# Patient Record
Sex: Female | Born: 1960 | Race: Black or African American | Hispanic: No | Marital: Married | Smoking: Current every day smoker
Health system: Southern US, Community
[De-identification: ages and names within clinical notes are randomized; demographics above are authoritative.]

## PROBLEM LIST (undated history)

## (undated) DIAGNOSIS — R7303 Prediabetes: Secondary | ICD-10-CM

## (undated) DIAGNOSIS — E7800 Pure hypercholesterolemia, unspecified: Secondary | ICD-10-CM

## (undated) HISTORY — DX: Prediabetes: R73.03

## (undated) HISTORY — PX: OTHER SURGICAL HISTORY: SHX169

## (undated) HISTORY — PX: BREAST EXCISIONAL BIOPSY: SUR124

## (undated) HISTORY — PX: PLANTAR FASCIA SURGERY: SHX746

## (undated) HISTORY — DX: Pure hypercholesterolemia, unspecified: E78.00

## (undated) HISTORY — PX: WRIST SURGERY: SHX841

---

## 1988-07-21 HISTORY — PX: TUBAL LIGATION: SHX77

## 2004-08-22 ENCOUNTER — Other Ambulatory Visit: Admission: RE | Admit: 2004-08-22 | Discharge: 2004-08-22 | Payer: Self-pay | Admitting: Gynecology

## 2004-09-10 ENCOUNTER — Encounter: Admission: RE | Admit: 2004-09-10 | Discharge: 2004-09-10 | Payer: Self-pay | Admitting: Gynecology

## 2004-09-14 ENCOUNTER — Encounter: Admission: RE | Admit: 2004-09-14 | Discharge: 2004-09-14 | Payer: Self-pay | Admitting: Interventional Radiology

## 2004-10-08 ENCOUNTER — Encounter: Admission: RE | Admit: 2004-10-08 | Discharge: 2004-10-08 | Payer: Self-pay | Admitting: Family Medicine

## 2004-10-17 ENCOUNTER — Observation Stay (HOSPITAL_COMMUNITY): Admission: RE | Admit: 2004-10-17 | Discharge: 2004-10-18 | Payer: Self-pay | Admitting: Interventional Radiology

## 2005-04-22 ENCOUNTER — Encounter: Admission: RE | Admit: 2005-04-22 | Discharge: 2005-04-22 | Payer: Self-pay | Admitting: Gynecology

## 2005-06-18 ENCOUNTER — Encounter: Admission: RE | Admit: 2005-06-18 | Discharge: 2005-06-18 | Payer: Self-pay | Admitting: Family Medicine

## 2005-09-30 ENCOUNTER — Encounter: Admission: RE | Admit: 2005-09-30 | Discharge: 2005-09-30 | Payer: Self-pay | Admitting: Family Medicine

## 2005-10-23 ENCOUNTER — Other Ambulatory Visit: Admission: RE | Admit: 2005-10-23 | Discharge: 2005-10-23 | Payer: Self-pay | Admitting: Gynecology

## 2006-02-10 IMAGING — US IR US GUIDE VASC ACCESS RIGHT
1 series · 1 of 1 positions shown · IV contrast (omnipaque)
Comparison: none

CLINICAL DATA: 43 year old female with a dominant submucosal 4.5 cm uterine fibroid with menorrhagia and pelvic pain.
ULTRASOUND AND FLUOROSCOPICALLY GUIDED SELECTIVE BILATERAL UTERINE ANGIOGRAMS WITH TRANSCATHETER EMBOLIZATION:
Radiologist:  Juank Dunlap, M.D. 
Guidance:  Ultrasound and fluoroscopic.
Complications:  No immediate complications.
Medications:  10 mg Versed, 350 mcg fentanyl, 2 mg of Dilaudid, 30 mg of Toradol, 1 g of Ancef and 200 mcg of nitroglycerin.
Contrast:  160 cc of Omnipaque 300.
Conscious Sedation Time:  145 minutes.
PROCEDURE/FINDINGS:
Informed consent was obtained from the patient.   
With ultrasound guidance, the right common femoral artery was identified.  Under sterile conditions and local anesthesia, micropuncture needle access was performed.  Images were obtained for documentation.  A 6 French sheath was inserted over a Bentson guidewire.  Initially, the left internal iliac artery was selected with a C2 catheter over a glidewire.  Selective left internal iliac angiogram was performed.  Through this access, a Terumo microcatheter and micro-glidewire were utilized to select the left uterine artery along the horizontal portion.  Contrast was injected for a selective left uterine angiogram.  This demonstrates a tortuous and large left uterine artery supplying the fibroid uterus.  From this location, complete embolization was performed of the left uterine artery with 1 vial of 500-700 micron Contour-SE particles, 2 vials of 500-700 micron Embospheres, and [DATE] vial of 700-900 micron Embospheres.
Following embolization, a follow-up angiogram was performed through the catheter demonstrating complete stasis of the left uterine artery with no longer opacification of the parenchymal branches.  
At this point, the catheter was removed.  The C2 catheter was retracted to the ipsilateral right internal iliac origin.  This catheter was unsuccessful in accessing the right internal iliac artery.  The catheter was exchanged for a Chung 2 catheter which was utilized to select the right internal iliac arterial origin.  Selective right internal iliac angiogram was performed identifying the right uterine artery.  Through the access, the Terumo microcatheter and glidewire were utilized to select the right uterine artery.  Selective right uterine angiogram was performed demonstrating an enlarged, tortuous, right uterine artery with evidence of sluggish flow upon injection consistent with cross-embolization from the left-sided injection.  
The catheter was placed in a horizontal portion of the right uterine artery lower in the pelvis.  From this location, complete embolization was performed with 2 vials of 500-700 micron embosphere particles.
A follow-up angiogram demonstrated complete stasis of the right uterine artery with no longer opacification of the parenchymal branches.
At this point, the microcatheter was removed.  The Chung 2 catheter was deformed over the bifurcation and removed.  Contrast was injected to confirm that the access site was appropriate for a percutaneous closure device.  The puncture site is within the common femoral artery and there is no evidence of vasospasm or adjacent major branches.  Therefore, hemostasis was obtained with a 6 French Starclose device .  The patient tolerated the procedure well and there were no immediate complications.

[Series 1: sp us guide vasc access*right* · 1 of 1 slices shown]
[im 1/1]
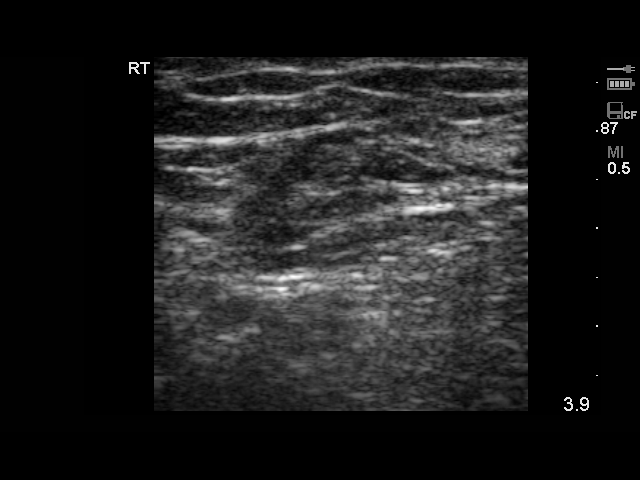

[1 of 1 positions shown; findings below may reference images not displayed]

IMPRESSION: Bilateral selective uterine angiograms with transcatheter embolization to complete stasis as described for treatment of symptomatic uterine fibroids.
Plan:  The patient will be admitted for overnight observation admission with pain management and hydration.

## 2006-02-10 IMAGING — XA IR TRANSCATH EMBOLIZATION
1 series · 13 of 24 positions shown · IV contrast (omnipaque)
Comparison: none

CLINICAL DATA: 43 year old female with a dominant submucosal 4.5 cm uterine fibroid with menorrhagia and pelvic pain.
ULTRASOUND AND FLUOROSCOPICALLY GUIDED SELECTIVE BILATERAL UTERINE ANGIOGRAMS WITH TRANSCATHETER EMBOLIZATION:
Radiologist:  Juank Dunlap, M.D. 
Guidance:  Ultrasound and fluoroscopic.
Complications:  No immediate complications.
Medications:  10 mg Versed, 350 mcg fentanyl, 2 mg of Dilaudid, 30 mg of Toradol, 1 g of Ancef and 200 mcg of nitroglycerin.
Contrast:  160 cc of Omnipaque 300.
Conscious Sedation Time:  145 minutes.
PROCEDURE/FINDINGS:
Informed consent was obtained from the patient.   
With ultrasound guidance, the right common femoral artery was identified.  Under sterile conditions and local anesthesia, micropuncture needle access was performed.  Images were obtained for documentation.  A 6 French sheath was inserted over a Bentson guidewire.  Initially, the left internal iliac artery was selected with a C2 catheter over a glidewire.  Selective left internal iliac angiogram was performed.  Through this access, a Terumo microcatheter and micro-glidewire were utilized to select the left uterine artery along the horizontal portion.  Contrast was injected for a selective left uterine angiogram.  This demonstrates a tortuous and large left uterine artery supplying the fibroid uterus.  From this location, complete embolization was performed of the left uterine artery with 1 vial of 500-700 micron Contour-SE particles, 2 vials of 500-700 micron Embospheres, and [DATE] vial of 700-900 micron Embospheres.
Following embolization, a follow-up angiogram was performed through the catheter demonstrating complete stasis of the left uterine artery with no longer opacification of the parenchymal branches.  
At this point, the catheter was removed.  The C2 catheter was retracted to the ipsilateral right internal iliac origin.  This catheter was unsuccessful in accessing the right internal iliac artery.  The catheter was exchanged for a Chung 2 catheter which was utilized to select the right internal iliac arterial origin.  Selective right internal iliac angiogram was performed identifying the right uterine artery.  Through the access, the Terumo microcatheter and glidewire were utilized to select the right uterine artery.  Selective right uterine angiogram was performed demonstrating an enlarged, tortuous, right uterine artery with evidence of sluggish flow upon injection consistent with cross-embolization from the left-sided injection.  
The catheter was placed in a horizontal portion of the right uterine artery lower in the pelvis.  From this location, complete embolization was performed with 2 vials of 500-700 micron embosphere particles.
A follow-up angiogram demonstrated complete stasis of the right uterine artery with no longer opacification of the parenchymal branches.
At this point, the microcatheter was removed.  The Chung 2 catheter was deformed over the bifurcation and removed.  Contrast was injected to confirm that the access site was appropriate for a percutaneous closure device.  The puncture site is within the common femoral artery and there is no evidence of vasospasm or adjacent major branches.  Therefore, hemostasis was obtained with a 6 French Starclose device .  The patient tolerated the procedure well and there were no immediate complications.

[Series 1000: run · 13 of 85 slices shown]
[im 1/85]
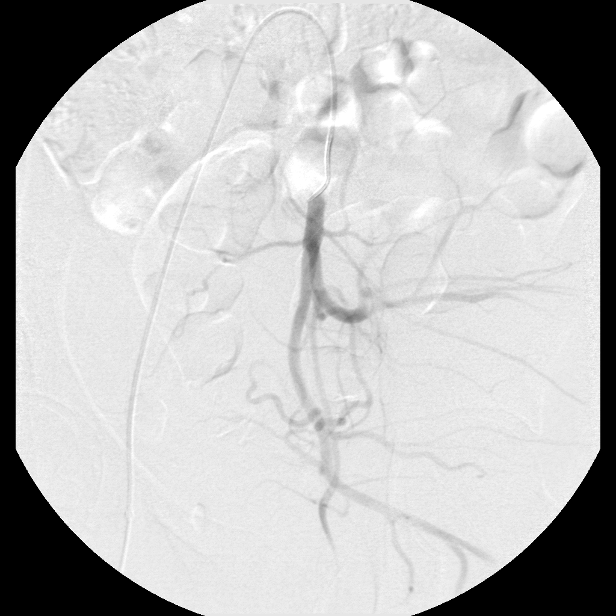
[im 8/85]
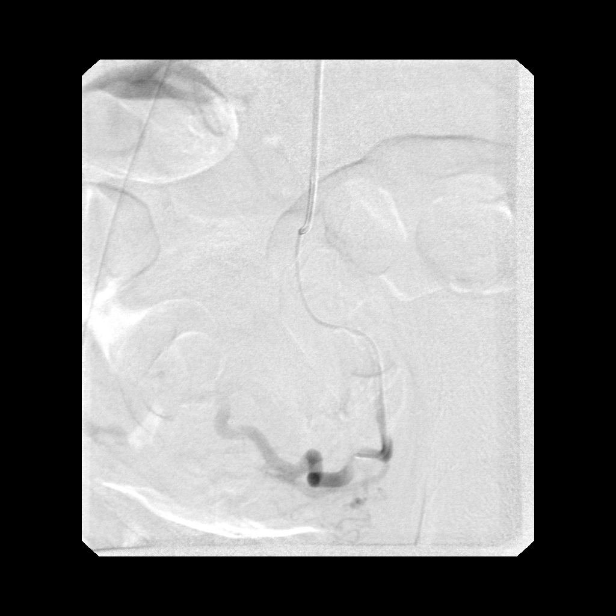
[im 15/85]
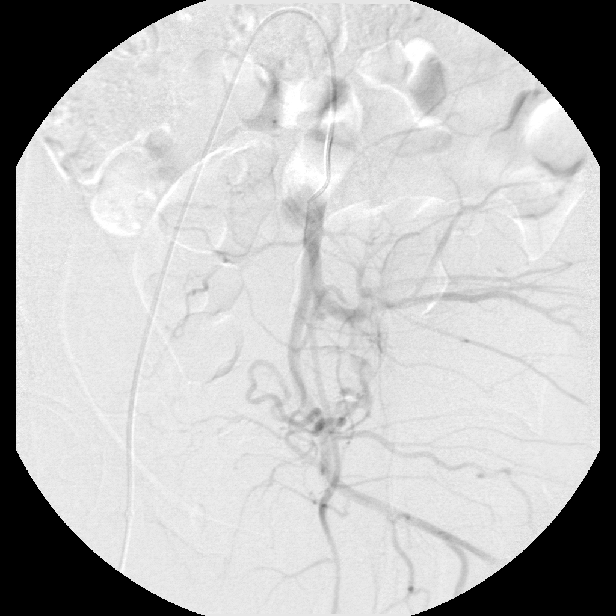
[im 22/85]
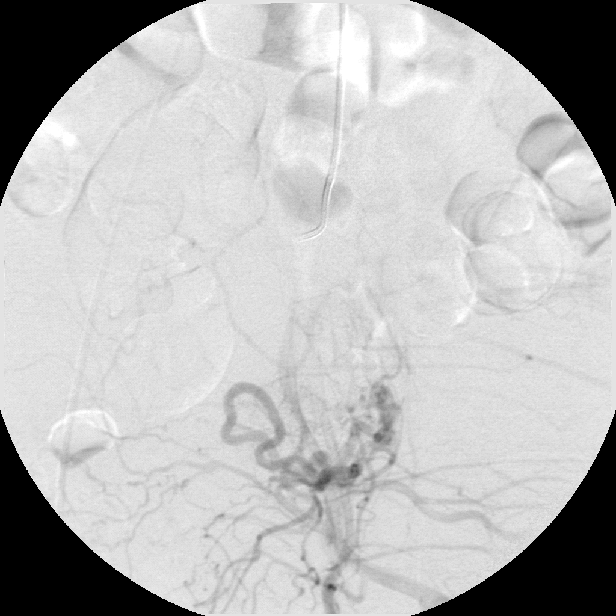
[im 30/85]
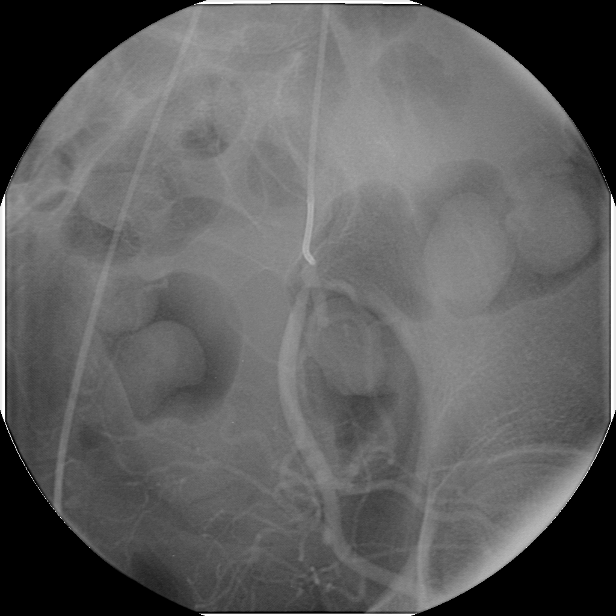
[im 37/85]
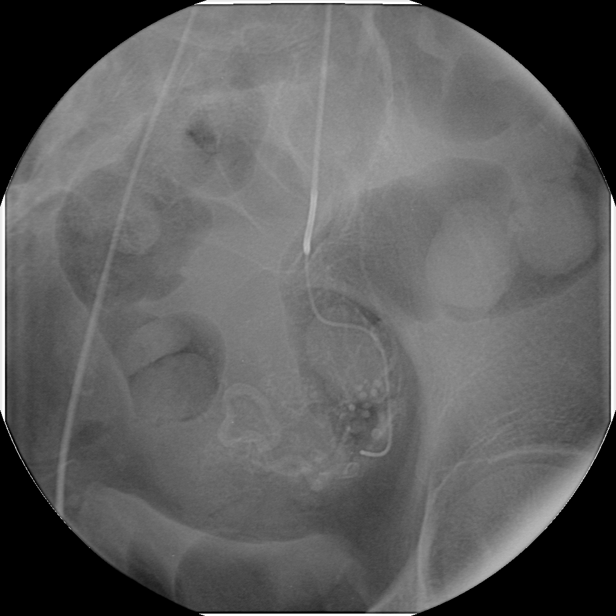
[im 44/85]
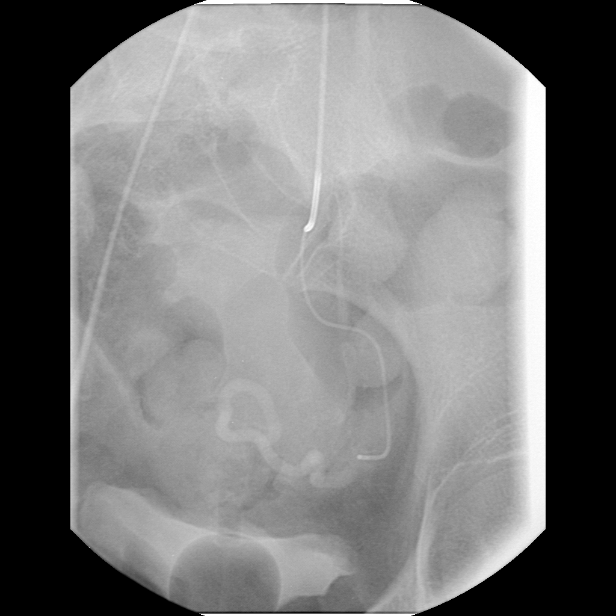
[im 48/85]
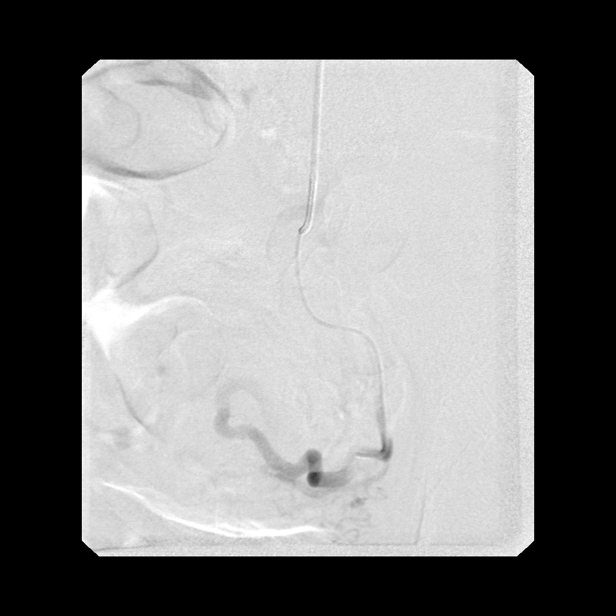
[im 55/85]
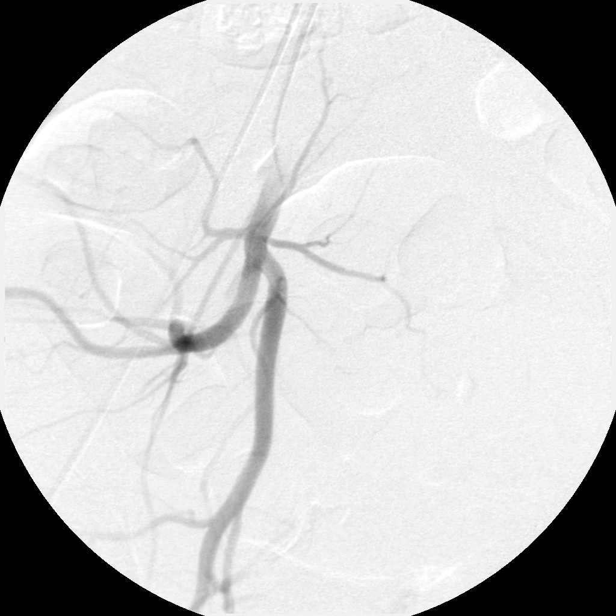
[im 63/85]
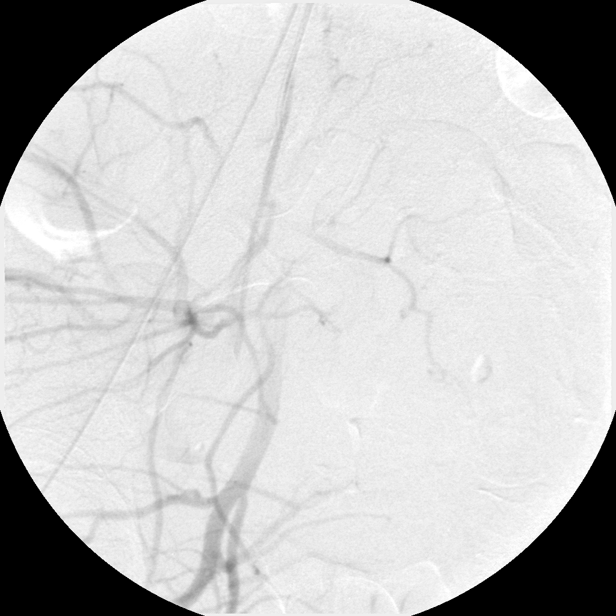
[im 70/85]
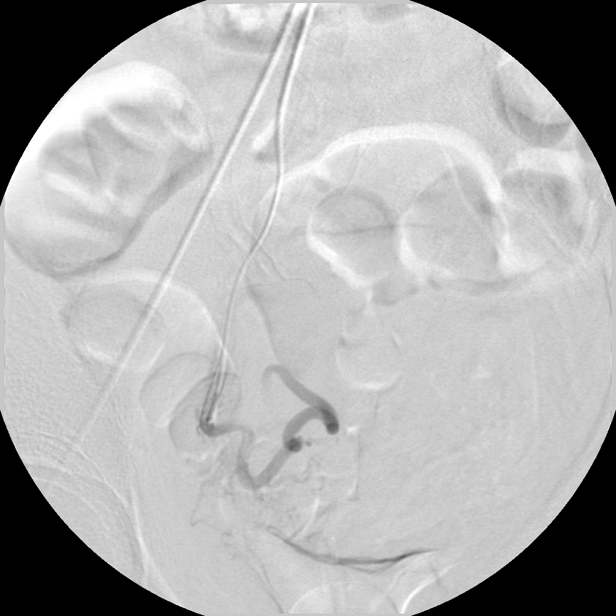
[im 77/85]
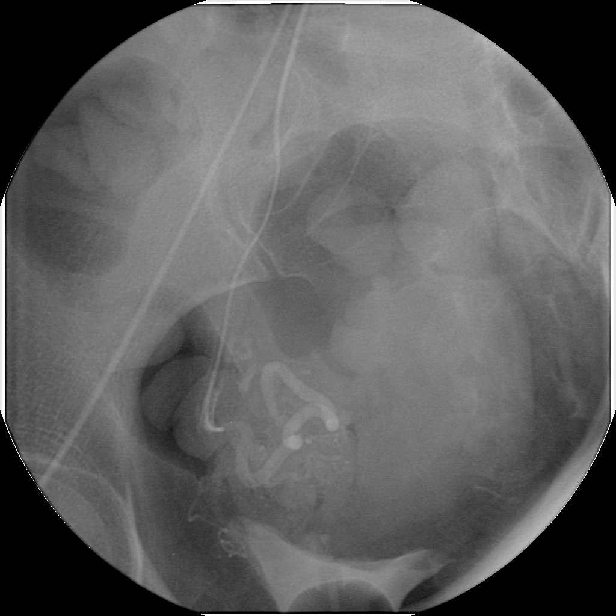
[im 85/85]
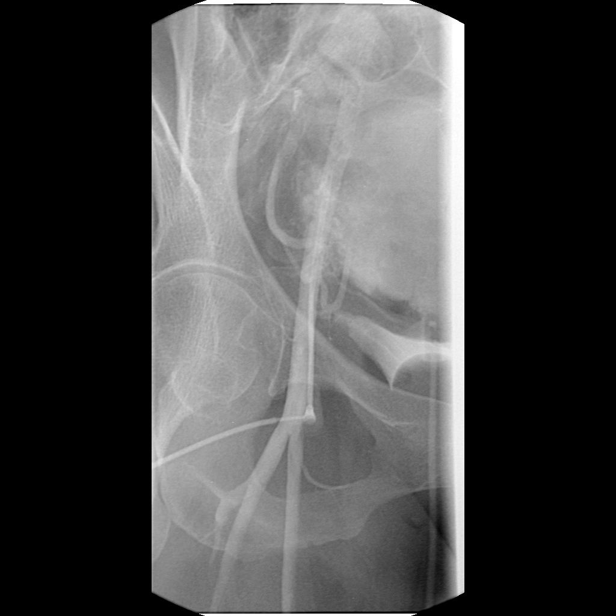

[13 of 24 positions shown; findings below may reference images not displayed]

IMPRESSION: Bilateral selective uterine angiograms with transcatheter embolization to complete stasis as described for treatment of symptomatic uterine fibroids.
Plan:  The patient will be admitted for overnight observation admission with pain management and hydration.

## 2006-11-04 ENCOUNTER — Encounter: Admission: RE | Admit: 2006-11-04 | Discharge: 2006-11-04 | Payer: Self-pay | Admitting: Family Medicine

## 2006-11-11 ENCOUNTER — Other Ambulatory Visit: Admission: RE | Admit: 2006-11-11 | Discharge: 2006-11-11 | Payer: Self-pay | Admitting: Family Medicine

## 2008-02-24 ENCOUNTER — Encounter: Admission: RE | Admit: 2008-02-24 | Discharge: 2008-02-24 | Payer: Self-pay | Admitting: Family Medicine

## 2010-12-20 ENCOUNTER — Emergency Department (HOSPITAL_COMMUNITY): Payer: BC Managed Care – PPO

## 2010-12-20 ENCOUNTER — Emergency Department (HOSPITAL_COMMUNITY)
Admission: EM | Admit: 2010-12-20 | Discharge: 2010-12-20 | Disposition: A | Discharge status: Home / Self Care | Payer: BC Managed Care – PPO | Attending: Emergency Medicine | Admitting: Emergency Medicine

## 2010-12-20 DIAGNOSIS — R079 Chest pain, unspecified: Secondary | ICD-10-CM | POA: Insufficient documentation

## 2010-12-20 DIAGNOSIS — F17200 Nicotine dependence, unspecified, uncomplicated: Secondary | ICD-10-CM | POA: Insufficient documentation

## 2010-12-20 LAB — BASIC METABOLIC PANEL
BUN: 14 mg/dL (ref 6–23)
CO2: 28 mEq/L (ref 19–32)
Calcium: 8.9 mg/dL (ref 8.4–10.5)
Chloride: 99 mEq/L (ref 96–112)
Creatinine, Ser: 0.93 mg/dL (ref 0.4–1.2)
GFR calc Af Amer: >60 mL/min (ref >60)
GFR calc non Af Amer: >60 mL/min (ref >60)
Glucose, Bld: 96 mg/dL (ref 70–99)
Potassium: 4.4 mEq/L (ref 3.5–5.1)
Sodium: 135 mEq/L (ref 135–145)

## 2010-12-20 LAB — TROPONIN I: Troponin I: <0.3 ng/mL (ref <0.30)

## 2010-12-20 LAB — DIFFERENTIAL
Basophils Absolute: 0 10*3/uL (ref 0.0–0.1)
Basophils Relative: 0 % (ref 0–1)
Eosinophils Absolute: 0 10*3/uL (ref 0.0–0.7)
Eosinophils Relative: 1 % (ref 0–5)
Lymphocytes Relative: 35 % (ref 12–46)
Lymphs Abs: 2 10*3/uL (ref 0.7–4.0)
Monocytes Absolute: 0.4 10*3/uL (ref 0.1–1.0)
Monocytes Relative: 6 % (ref 3–12)
Neutro Abs: 3.4 10*3/uL (ref 1.7–7.7)
Neutrophils Relative %: 58 % (ref 43–77)

## 2010-12-20 LAB — CBC
HCT: 39.9 % (ref 36.0–46.0)
Hemoglobin: 13.4 g/dL (ref 12.0–15.0)
MCH: 31.5 pg (ref 26.0–34.0)
MCHC: 33.6 g/dL (ref 30.0–36.0)
MCV: 93.7 fL (ref 78.0–100.0)
Platelets: 194 10*3/uL (ref 150–400)
RBC: 4.26 MIL/uL (ref 3.87–5.11)
RDW: 13.1 % (ref 11.5–15.5)
WBC: 5.8 10*3/uL (ref 4.0–10.5)

## 2010-12-20 LAB — D-DIMER, QUANTITATIVE: D-Dimer, Quant: 0.26 ug/mL-FEU (ref 0.00–0.48)

## 2010-12-20 LAB — CK TOTAL AND CKMB (NOT AT ARMC)
CK, MB: 1.5 ng/mL (ref 0.3–4.0)
Relative Index: 1.3 (ref 0.0–2.5)
Total CK: 118 U/L (ref 7–177)

## 2013-03-07 ENCOUNTER — Other Ambulatory Visit: Payer: Self-pay | Admitting: Family Medicine

## 2013-03-07 DIAGNOSIS — R109 Unspecified abdominal pain: Secondary | ICD-10-CM

## 2015-08-20 ENCOUNTER — Other Ambulatory Visit: Payer: Self-pay | Admitting: Obstetrics and Gynecology

## 2015-08-20 DIAGNOSIS — N644 Mastodynia: Secondary | ICD-10-CM

## 2015-08-23 ENCOUNTER — Other Ambulatory Visit: Payer: Self-pay | Admitting: Obstetrics and Gynecology

## 2015-08-23 ENCOUNTER — Ambulatory Visit
Admission: RE | Admit: 2015-08-23 | Discharge: 2015-08-23 | Disposition: A | Discharge status: Home / Self Care | Payer: BLUE CROSS/BLUE SHIELD | Source: Ambulatory Visit | Attending: Obstetrics and Gynecology | Admitting: Obstetrics and Gynecology

## 2015-08-23 DIAGNOSIS — M79621 Pain in right upper arm: Secondary | ICD-10-CM

## 2015-08-23 DIAGNOSIS — N644 Mastodynia: Secondary | ICD-10-CM

## 2016-11-05 ENCOUNTER — Other Ambulatory Visit: Payer: Self-pay | Admitting: Obstetrics and Gynecology

## 2016-11-05 DIAGNOSIS — N644 Mastodynia: Secondary | ICD-10-CM

## 2017-12-24 ENCOUNTER — Other Ambulatory Visit: Payer: Self-pay | Admitting: Obstetrics & Gynecology

## 2017-12-24 DIAGNOSIS — R928 Other abnormal and inconclusive findings on diagnostic imaging of breast: Secondary | ICD-10-CM

## 2018-01-04 ENCOUNTER — Ambulatory Visit
Admission: RE | Admit: 2018-01-04 | Discharge: 2018-01-04 | Disposition: A | Discharge status: Home / Self Care | Payer: 59 | Source: Ambulatory Visit | Attending: Obstetrics & Gynecology | Admitting: Obstetrics & Gynecology

## 2018-01-04 ENCOUNTER — Ambulatory Visit: Payer: BLUE CROSS/BLUE SHIELD

## 2018-01-04 DIAGNOSIS — R928 Other abnormal and inconclusive findings on diagnostic imaging of breast: Secondary | ICD-10-CM

## 2018-06-21 ENCOUNTER — Ambulatory Visit: Payer: 59 | Admitting: Neurology

## 2018-06-21 ENCOUNTER — Encounter

## 2018-09-02 ENCOUNTER — Encounter: Payer: Self-pay | Admitting: Neurology

## 2018-09-02 ENCOUNTER — Ambulatory Visit (INDEPENDENT_AMBULATORY_CARE_PROVIDER_SITE_OTHER): Payer: 59 | Admitting: Neurology

## 2018-09-02 VITALS — BP 128/85 | HR 75 | Ht 66.75 in | Wt 249.0 lb

## 2018-09-02 DIAGNOSIS — G44021 Chronic cluster headache, intractable: Secondary | ICD-10-CM | POA: Diagnosis not present

## 2018-09-02 DIAGNOSIS — G44321 Chronic post-traumatic headache, intractable: Secondary | ICD-10-CM

## 2018-09-02 DIAGNOSIS — H532 Diplopia: Secondary | ICD-10-CM

## 2018-09-02 DIAGNOSIS — R51 Headache with orthostatic component, not elsewhere classified: Secondary | ICD-10-CM

## 2018-09-02 DIAGNOSIS — R519 Headache, unspecified: Secondary | ICD-10-CM

## 2018-09-02 NOTE — Patient Instructions (Addendum)
MRI of the brain Labs  Verapamil tablets What is this medicine? VERAPAMIL (ver AP a mil) is a calcium-channel blocker. It affects the amount of calcium found in your heart and muscle cells. This relaxes your blood vessels, which can reduce the amount of work the heart has to do. This medicine is used to treat chest pain caused by angina, high blood pressure, and controls heart rate in certain conditions. This medicine may be used for other purposes like cluster headaches; ask your health care provider or pharmacist if you have questions. COMMON BRAND NAME(S): Calan What should I tell my health care provider before I take this medicine? They need to know if you have any of these conditions: -heart or blood vessel disease -heart rhythm disturbances such as sick sinus syndrome, ventricular arrhythmias, Wolff-Parkinson-White syndrome, or Lown-Ganong-Levine syndrome -liver or kidney disease -low blood pressure -an unusual or allergic reaction to verapamil, other medicines, foods, dyes, or preservatives -pregnant or trying to get pregnant -breast-feeding How should I use this medicine? Take this medicine by mouth with a glass of water. Follow the directions on the prescription label. This medicine can be taken with or without food. Take your doses at regular intervals. Do not take your medicine more often than directed. Talk to your pediatrician regarding the use of this medicine in children. Special care may be needed. Overdosage: If you think you have taken too much of this medicine contact a poison control center or emergency room at once. NOTE: This medicine is only for you. Do not share this medicine with others. What if I miss a dose? If you miss a dose, take it as soon as you can. If it is almost time for your next dose, take only that dose. Do not take double or extra doses. What may interact with this medicine? Do not take this medicine with any of the  following: -cisapride -disopyramide -dofetilide -grapefruit juice -hawthorn -pimozide -red yeast rice This medicine may also interact with the following medications: -barbiturates such as phenobarbital -cimetidine -cyclosporine -lithium -local anesthetics or general anesthetics -medicines for heart rhythm problems like amiodarone, digoxin, flecainide, procainamide, quinidine -medicines for high blood pressure or heart problems -medicines for seizures like carbamazepine and phenytoin -rifampin, rifabutin or rifapentine -theophylline or aminophylline This list may not describe all possible interactions. Give your health care provider a list of all the medicines, herbs, non-prescription drugs, or dietary supplements you use. Also tell them if you smoke, drink alcohol, or use illegal drugs. Some items may interact with your medicine. What should I watch for while using this medicine? Check your blood pressure and pulse rate regularly. Ask your doctor or health care professional what your blood pressure and pulse rate should be and when you should contact him or her. Do not suddenly stop taking this medicine. Ask your doctor or health care professional how to gradually reduce the dose. You may get drowsy or dizzy. Do not drive, use machinery, or do anything that needs mental alertness until you know how this medicine affects you. Do not stand or sit up quickly, especially if you are an older patient. This reduces the risk of dizzy or fainting spells. Alcohol may interfere with the effect of this medicine. Avoid alcoholic drinks. What side effects may I notice from receiving this medicine? Side effects that you should report to your doctor or health care professional as soon as possible: -difficulty breathing -dizziness or light headedness -fainting -fast heartbeat, palpitations, irregular heartbeat, or chest pain -skin rash -  slow heartbeat -swelling of the legs or ankles Side effects that  usually do not require medical attention (report to your doctor or health care professional if they continue or are bothersome): -constipation -facial flushing -headache -nausea, vomiting -sexual dysfunction -weakness or tiredness This list may not describe all possible side effects. Call your doctor for medical advice about side effects. You may report side effects to FDA at 1-800-FDA-1088. Where should I keep my medicine? Keep out of the reach of children. Store at room temperature between 15 and 25 degrees C (59 and 77 degrees F). Protect from light. Keep container tightly closed. Throw away any unused medicine after the expiration date. NOTE: This sheet is a summary. It may not cover all possible information. If you have questions about this medicine, talk to your doctor, pharmacist, or health care provider.  2019 Elsevier/Gold Standard (2008-04-03 17:23:53)   Migraine Headache A migraine headache is an intense, throbbing pain on one side or both sides of the head. Migraines may also cause other symptoms, such as nausea, vomiting, and sensitivity to light and noise. What are the causes? Doing or taking certain things may also trigger migraines, such as:  Alcohol.  Smoking.  Medicines, such as: ? Medicine used to treat chest pain (nitroglycerine). ? Birth control pills. ? Estrogen pills. ? Certain blood pressure medicines.  Aged cheeses, chocolate, or caffeine.  Foods or drinks that contain nitrates, glutamate, aspartame, or tyramine.  Physical activity. Other things that may trigger a migraine include:  Menstruation.  Pregnancy.  Hunger.  Stress, lack of sleep, too much sleep, or fatigue.  Weather changes. What increases the risk? The following factors may make you more likely to experience migraine headaches:  Age. Risk increases with age.  Family history of migraine headaches.  Being Caucasian.  Depression and anxiety.  Obesity.  Being a  woman.  Having a hole in the heart (patent foramen ovale) or other heart problems. What are the signs or symptoms? The main symptom of this condition is pulsating or throbbing pain. Pain may:  Happen in any area of the head, such as on one side or both sides.  Interfere with daily activities.  Get worse with physical activity.  Get worse with exposure to bright lights or loud noises. Other symptoms may include:  Nausea.  Vomiting.  Dizziness.  General sensitivity to bright lights, loud noises, or smells. Before you get a migraine, you may get warning signs that a migraine is developing (aura). An aura may include:  Seeing flashing lights or having blind spots.  Seeing bright spots, halos, or zigzag lines.  Having tunnel vision or blurred vision.  Having numbness or a tingling feeling.  Having trouble talking.  Having muscle weakness. How is this diagnosed? A migraine headache can be diagnosed based on:  Your symptoms.  A physical exam.  Tests, such as CT scan or MRI of the head. These imaging tests can help rule out other causes of headaches.  Taking fluid from the spine (lumbar puncture) and analyzing it (cerebrospinal fluid analysis, or CSF analysis). How is this treated? A migraine headache is usually treated with medicines that:  Relieve pain.  Relieve nausea.  Prevent migraines from coming back. Treatment may also include:  Acupuncture.  Lifestyle changes like avoiding foods that trigger migraines. Follow these instructions at home: Medicines  Take over-the-counter and prescription medicines only as told by your health care provider.  Do not drive or use heavy machinery while taking prescription pain medicine.  To  prevent or treat constipation while you are taking prescription pain medicine, your health care provider may recommend that you: ? Drink enough fluid to keep your urine clear or pale yellow. ? Take over-the-counter or prescription  medicines. ? Eat foods that are high in fiber, such as fresh fruits and vegetables, whole grains, and beans. ? Limit foods that are high in fat and processed sugars, such as fried and sweet foods. Lifestyle  Avoid alcohol use.  Do not use any products that contain nicotine or tobacco, such as cigarettes and e-cigarettes. If you need help quitting, ask your health care provider.  Get at least 8 hours of sleep every night.  Limit your stress. General instructions      Keep a journal to find out what may trigger your migraine headaches. For example, write down: ? What you eat and drink. ? How much sleep you get. ? Any change to your diet or medicines.  If you have a migraine: ? Avoid things that make your symptoms worse, such as bright lights. ? It may help to lie down in a dark, quiet room. ? Do not drive or use heavy machinery. ? Ask your health care provider what activities are safe for you while you are experiencing symptoms.  Keep all follow-up visits as told by your health care provider. This is important. Contact a health care provider if:  You develop symptoms that are different or more severe than your usual migraine symptoms. Get help right away if:  Your migraine becomes severe.  You have a fever.  You have a stiff neck.  You have vision loss.  Your muscles feel weak or like you cannot control them.  You start to lose your balance often.  You develop trouble walking.  You faint. This information is not intended to replace advice given to you by your health care provider. Make sure you discuss any questions you have with your health care provider. Document Released: 07/07/2005 Document Revised: 01/25/2016 Document Reviewed: 12/24/2015 Elsevier Interactive Patient Education  2019 [employer]   Cluster Headache A cluster headache is a type of headache that causes deep, intense head pain. Cluster headaches can last from 15 minutes to 3 hours. They usually  occur:  On one side of the head. They may occur on the other side when a new cluster of headaches begins.  Repeatedly over weeks to months.  Several times a day.  At the same time of day, often at night.  More often in the fall and springtime. What are the causes? The cause of this condition is not known. What increases the risk? This condition is more likely to develop in:  Males.  People who drink alcohol.  People who smoke or use products that contain nicotine or tobacco.  People who take medicines that cause blood vessels to expand, such as nitroglycerin.  People who take antihistamines. What are the signs or symptoms? Symptoms of this condition include:  Severe pain on one side of the head that begins behind or around your eye or temple.  Pain on one side of the head.  Nausea.  Sensitivity to light.  Runny nose and nasal stuffiness.  Sweaty, pale skin on the face.  Droopy or swollen eyelid, eye redness, or tearing.  Restlessness and agitation. How is this diagnosed? This condition may be diagnosed based on:  Your symptoms.  A physical exam. Your health care provider may order tests to see if your headaches are caused by another medical condition.  These tests may show that you do not have cluster headaches. Tests may include:  A CT scan of your head.  An MRI of your head.  Lab tests. How is this treated? This condition may be treated with:  Medicines to relieve pain and to prevent repeated (recurrent) attacks. Some people may need a combination of medicines.  Oxygen. This helps to relieve pain. Follow these instructions at home: Headache diary Keep a headache diary as told by your health care provider. Doing this can help you and your health care provider figure out what triggers your headaches. In your headache diary, include information about:  The time of day that your headache started and what you were doing when it began.  How long your  headache lasted.  Where your pain started and whether it moved to other areas.  The type of pain, such as burning, stabbing, throbbing, or cramping.  Your level of pain. Use a pain scale and rate the pain with a number from 1 (mild) up to 10 (severe).  The treatment that you used, and any change in symptoms after treatment.  Medicines  Take over-the-counter and prescription medicines only as told by your health care provider.  Do not drive or use heavy machinery while taking prescription pain medicine.  Use oxygen as told by your health care provider. Lifestyle  Follow a regular sleep schedule. Do not vary the time that you go to bed or the amount that you sleep from day to day. It is important to stay on the same schedule during a cluster period to help prevent headaches.  Exercise regularly.  Eat a healthy diet and avoid foods that may trigger your headaches.  Avoid alcohol.  Do not use any products that contain nicotine or tobacco, such as cigarettes and e-cigarettes. If you need help quitting, ask your health care provider. Contact a health care provider if:  Your headaches change, become more severe, or occur more often.  The medicine or oxygen that your health care provider recommended does not help. Get help right away if:  You faint.  You have weakness or numbness, especially on one side of your body or face.  You have double vision.  You have nausea or vomiting that does not go away within several hours.  You have trouble talking, walking, or keeping your balance.  You have pain or stiffness in your neck.  You have a fever. Summary  A cluster headache is a type of headache that causes deep, intense head pain, usually on one side of the head.  Keep a headache diary to help discover what triggers your headaches.  A regular sleep schedule can help prevent headaches. This information is not intended to replace advice given to you by your health care  provider. Make sure you discuss any questions you have with your health care provider. Document Released: 07/07/2005 Document Revised: 03/18/2016 Document Reviewed: 03/18/2016 Elsevier Interactive Patient Education  2019 [employer]

## 2018-09-02 NOTE — Progress Notes (Signed)
[facility]    Provider:  Dr Jaynee Eagles Referring Provider: Linda Hedges, DO, Willeen Niece PA Primary Care Provider:  Linda Hedges, DO, [place]  CC:  headaches  HPI:  Karina Brooks is a 58 y.o. female here as requested by provider Linda Hedges, DO for headaches. PMHx joint pain, constipation, hyperlipidemia, obesity, prediabetes, back pain, tobacco abuse she is a current every day smoker. She gets headaches, sharp intense pain on the left in the forehead, it can last all day or three days. I started after she fell last June 2019, she bent down to get a coffee cup and she fell backwards and hit her head into the frame of the door very hard she even felt "dazed and confused", no bleeding, her head was sore, no loss of consciousness. She took ibuprofen which helped. A week later she started having headaches on the left temple. Always on th left temple, painful, pulsating. She is sensitive on the left side of the head. She can have it all day. She is afraid to go to sleep. No history of migraines but her nephew has it. No light or sound sensitivity, no nausea, advil helps. The headaches can last days. The sensitivity is always there but sometimes it gets worse. 3 days of the month it becomes worse and can be severe. She has blurry vision. She has episodes of diplopia. She has morning headaches.   Reviewed notes, labs and imaging from outside physicians, which showed:  Reviewed notes from a [place], [place].  Patient was seen for acute intractable headache and was given Toradol in the office.  She was advised to avoid any anti-inflammatories over-the-counter medications that day.  She was diagnosed with likely postconcussive headaches.  That morning she awoke with an acute headache.  The headache actually woke her up.  It was in the left temporal region with associated nausea but no vision changes.  Not the worst headache of her life.  Has been having headaches occurring  episodically for several months.  She fell in July and hit her head denies losing consciousness, this is referring to July 2019.  The month after she fell she started getting more frequent headaches 2-3 times weekly.  She was symptom-free for 3 weeks but now acutely symptomatic the day that she was seen.  They normally occur in the back of her head and temporal region.  No personal history of migraines or headache history.  Exam was normal including neurologic exam.  BMI 39.98.  Review of Systems: Patient complains of symptoms per HPI as well as the following symptoms: Blurred vision, cough, feeling hot, feeling cold, headache, snoring. Pertinent negatives and positives per HPI. All others negative.   Social History   Socioeconomic History  . Marital status: Married    Spouse name: Not on file  . Number of children: 2  . Years of education: 55  . Highest education level: Not on file  Occupational History  . Not on file  Social Needs  . Financial resource strain: Not on file  . Food insecurity:    Worry: Not on file    Inability: Not on file  . Transportation needs:    Medical: Not on file    Non-medical: Not on file  Tobacco Use  . Smoking status: Current Every Day Smoker    Types: Cigarettes    Start date: 2008  . Smokeless tobacco: Never Used  . Tobacco comment: increased to 1 pack per week in the last  year  Substance and Sexual Activity  . Alcohol use: Yes    Alcohol/week: 1.0 - 2.0 standard drinks    Types: 1 - 2 Glasses of wine per week    Comment: socially, not every weekend   . Drug use: Not Currently    Comment: smoked marijuana briefly in her 78s   . Sexual activity: Not on file  Lifestyle  . Physical activity:    Days per week: Not on file    Minutes per session: Not on file  . Stress: Not on file  Relationships  . Social connections:    Talks on phone: Not on file    Gets together: Not on file    Attends religious service: Not on file    Active member of  club or organization: Not on file    Attends meetings of clubs or organizations: Not on file    Relationship status: Not on file  . Intimate partner violence:    Fear of current or ex partner: Not on file    Emotionally abused: Not on file    Physically abused: Not on file    Forced sexual activity: Not on file  Other Topics Concern  . Not on file  Social History Narrative   Lives at home with husband    Caffeine: 1 cup daily   Left handed    Family History  Problem Relation Age of Onset  . Heart attack Mother   . Stroke Mother   . Dementia Father   . Heart attack Father   . Stroke Father   . Migraines Nephew     Past Medical History:  Diagnosis Date  . High cholesterol   . Prediabetes     Patient Active Problem List   Diagnosis Date Noted  . 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 BREAST EXCISIONAL BIOPSY Left 90s   benign  . left shoulder surgery    . PLANTAR FASCIA SURGERY Left   . TUBAL LIGATION  1990  . uterine ablation    . WRIST SURGERY Right     Current Outpatient Medications  Medication Sig Dispense Refill  . APPLE CIDER VINEGAR PO Take by mouth. gummies    . Ascorbic Acid (VITAMIN C PO) Take by mouth.    Marland Kitchen BIOTIN PO Take by mouth.    . Cholecalciferol (VITAMIN D3 PO) Take 2,000 Int'l Units by mouth.    . co-enzyme Q-10 50 MG capsule Take 50 mg by mouth daily.     . Multiple Vitamin (MULTIVITAMIN) capsule Take by mouth.    . nystatin-triamcinolone (MYCOLOG II) cream Apply 1 application topically as needed.     . Omega-3 Fatty Acids (FISH OIL) 1000 MG CAPS Take by mouth.    Marland Kitchen omeprazole (PRILOSEC) 20 MG capsule Take 20 mg by mouth daily.    . vitamin B-12 (CYANOCOBALAMIN) 100 MCG tablet Take 200 mcg by mouth.     Marland Kitchen ibuprofen (ADVIL,MOTRIN) 600 MG tablet Take 600 mg by mouth as needed.     . loratadine (CLARITIN) 10 MG tablet Take by mouth.      No current facility-administered medications for this visit.     Allergies as of 09/02/2018  . (Not on File)    Vitals: BP 128/85 (BP Location: Right Arm, Patient Position: Sitting)   Pulse 75   Ht 5' 6.75" (1.695 m)   Wt 249 lb (112.9 kg)  BMI 39.29 kg/m  Last Weight:  Wt Readings from Last 1 Encounters:  09/02/18 249 lb (112.9 kg)   Last Height:   Ht Readings from Last 1 Encounters:  09/02/18 5' 6.75" (1.695 m)     Physical exam: Exam: Gen: NAD, conversant, well nourised, obese, well groomed                     CV: RRR, no MRG. No Carotid Bruits. No peripheral edema, warm, nontender Eyes: Conjunctivae clear without exudates or hemorrhage  Neuro: Detailed Neurologic Exam  Speech:    Speech is normal; fluent and spontaneous with normal comprehension.  Cognition:    The patient is oriented to person, place, and time;     recent and remote memory intact;     language fluent;     normal attention, concentration,     fund of knowledge Cranial Nerves:    The pupils are equal, round, and reactive to light. The fundi are normal and spontaneous venous pulsations are present. Visual fields are full to finger confrontation. Extraocular movements are intact. Trigeminal sensation is intact and the muscles of mastication are normal. The face is symmetric. The palate elevates in the midline. Hearing intact. Voice is normal. Shoulder shrug is normal. The tongue has normal motion without fasciculations.   Coordination:    Normal finger to nose and heel to shin. Normal rapid alternating movements.   Gait:    Heel-toe and tandem gait are normal.   Motor Observation:    No asymmetry, no atrophy, and no involuntary movements noted. Tone:    Normal muscle tone.    Posture:    Posture is normal. normal erect    Strength:    Strength is V/V in the upper and lower limbs.      Sensation: intact to LT     Reflex Exam:  DTR's:    Deep tendon reflexes in the upper and lower  extremities are normal bilaterally.   Toes:    The toes are downgoing bilaterally.   Clonus:    Clonus is absent.    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 Not migrainous. May be cluster headaches and unclear if associated with her head injury. But given concerning symptoms she needs further evaluation. Discussed possible treatment options such as Verapamil or Topiramate but she prefers to wait on treatment at this time.   MRI brain due to concerning symptoms of left temporal headaches, intractable new headaches after the age of 18, diplopia, morning and positional headaches,vision changes  to look for space occupying mass, chiari or intracranial hypertension (pseudotumor).  Discussed sleep apnea, her ESS is 6 and she denies daytime fatigue, no indication for sleep eval at this time.  Very low likelihood or temporal arteritis but will check esr/crp  Orders Placed This Encounter  Procedures  . MR BRAIN W WO CONTRAST  . Basic Metabolic Panel  . Sedimentation rate  . C-reactive protein    Cc: Linda Hedges, DO,    Sarina Ill, MD  [facility] [address] [place], [place] [address]  Phone [phone] Fax [phone]

## 2018-09-03 LAB — BASIC METABOLIC PANEL
BUN/Creatinine Ratio: 13 (ref 9–23)
BUN: 13 mg/dL (ref 6–24)
CO2: 24 mmol/L (ref 20–29)
Calcium: 9.4 mg/dL (ref 8.7–10.2)
Chloride: 104 mmol/L (ref 96–106)
Creatinine, Ser: 0.98 mg/dL (ref 0.57–1.00)
GFR calc Af Amer: 74 mL/min/{1.73_m2} (ref >59)
GFR calc non Af Amer: 64 mL/min/{1.73_m2} (ref >59)
Glucose: 97 mg/dL (ref 65–99)
Potassium: 4.6 mmol/L (ref 3.5–5.2)
Sodium: 142 mmol/L (ref 134–144)

## 2018-09-03 LAB — SEDIMENTATION RATE: Sed Rate: 26 mm/hr (ref 0–40)

## 2018-09-03 LAB — C-REACTIVE PROTEIN: CRP: 19 mg/L — ABNORMAL HIGH (ref 0–10)

## 2018-09-04 DIAGNOSIS — R51 Headache: Principal | ICD-10-CM

## 2018-09-04 DIAGNOSIS — R519 Headache, unspecified: Secondary | ICD-10-CM | POA: Insufficient documentation

## 2018-09-04 DIAGNOSIS — G44321 Chronic post-traumatic headache, intractable: Secondary | ICD-10-CM | POA: Insufficient documentation

## 2018-09-06 ENCOUNTER — Telehealth: Payer: Self-pay | Admitting: Neurology

## 2018-09-06 ENCOUNTER — Telehealth: Payer: Self-pay | Admitting: *Deleted

## 2018-09-06 NOTE — Telephone Encounter (Signed)
-----   Message from Anson Fret, MD sent at 09/04/2018  8:33 AM EST ----- Labs unremarkable thanks

## 2018-09-06 NOTE — Telephone Encounter (Signed)
Called pt on mobile # and LVM (ok per DPR) advising labs unremarkable, no concerns. Left office number for call back if she has any questions.

## 2018-09-06 NOTE — Telephone Encounter (Signed)
[employer]ign[employer] order sent to [facility]. They will obtain the auth and will reach out tot he pt to schedule.

## 2020-09-24 ENCOUNTER — Emergency Department (HOSPITAL_COMMUNITY)
Admission: EM | Admit: 2020-09-24 | Discharge: 2020-09-24 | Disposition: A | Discharge status: Home / Self Care | Payer: Managed Care, Other (non HMO) | Attending: Emergency Medicine | Admitting: Emergency Medicine

## 2020-09-24 ENCOUNTER — Encounter (HOSPITAL_COMMUNITY): Payer: Self-pay

## 2020-09-24 ENCOUNTER — Other Ambulatory Visit: Payer: Self-pay

## 2020-09-24 ENCOUNTER — Emergency Department (HOSPITAL_COMMUNITY): Payer: Managed Care, Other (non HMO)

## 2020-09-24 DIAGNOSIS — M79604 Pain in right leg: Secondary | ICD-10-CM | POA: Diagnosis present

## 2020-09-24 DIAGNOSIS — R0789 Other chest pain: Secondary | ICD-10-CM | POA: Insufficient documentation

## 2020-09-24 DIAGNOSIS — M79662 Pain in left lower leg: Secondary | ICD-10-CM | POA: Insufficient documentation

## 2020-09-24 DIAGNOSIS — F17210 Nicotine dependence, cigarettes, uncomplicated: Secondary | ICD-10-CM | POA: Diagnosis not present

## 2020-09-24 LAB — BASIC METABOLIC PANEL
Anion gap: 7 (ref 5–15)
BUN: 16 mg/dL (ref 6–20)
CO2: 26 mmol/L (ref 22–32)
Calcium: 9 mg/dL (ref 8.9–10.3)
Chloride: 107 mmol/L (ref 98–111)
Creatinine, Ser: 1.03 mg/dL — ABNORMAL HIGH (ref 0.44–1.00)
GFR, Estimated: >60 mL/min (ref >60)
Glucose, Bld: 103 mg/dL — ABNORMAL HIGH (ref 70–99)
Potassium: 4 mmol/L (ref 3.5–5.1)
Sodium: 140 mmol/L (ref 135–145)

## 2020-09-24 LAB — CBC
HCT: 36.2 % (ref 36.0–46.0)
Hemoglobin: 11.7 g/dL — ABNORMAL LOW (ref 12.0–15.0)
MCH: 31.2 pg (ref 26.0–34.0)
MCHC: 32.3 g/dL (ref 30.0–36.0)
MCV: 96.5 fL (ref 80.0–100.0)
Platelets: 213 10*3/uL (ref 150–400)
RBC: 3.75 MIL/uL — ABNORMAL LOW (ref 3.87–5.11)
RDW: 13.1 % (ref 11.5–15.5)
WBC: 6.9 10*3/uL (ref 4.0–10.5)
nRBC: 0 % (ref 0.0–0.2)

## 2020-09-24 LAB — TROPONIN I (HIGH SENSITIVITY): Troponin I (High Sensitivity): <2 ng/L (ref <18)

## 2020-09-24 NOTE — ED Provider Notes (Signed)
[facility]-EMERGENCY DEPT Provider Note   CSN: [id number] Arrival date & time: 09/24/20  1704     History Chief Complaint  Patient presents with  . Leg Pain  . Chest Pain    Karina Brooks is a 60 y.o. female.  60 year old female with prior medical history detailed below presents for evaluation of 2 problems.  Patient reports intermittent nearly daily sharp anterior midline chest discomfort.  This is been going on for the last week.  She denies any associated nausea or diaphoresis.  She denies any shortness of breath.  She denies any active chest discomfort at time of evaluation.  Patient is also complaining of pain to the posterior aspect of the right lower calf.  Patient reports that this pain is worse with standing or stretching of the calf muscle.  Patient right leg pain has been an ongoing for the last 3 to 4 days.  The history is provided by the patient and medical records.  Leg Pain Location:  Leg Leg location:  R leg Pain details:    Quality:  Aching   Radiates to:  Does not radiate   Severity:  Mild   Onset quality:  Gradual   Duration:  4 days   Timing:  Constant   Progression:  Waxing and waning Chronicity:  New Dislocation: no   Relieved by:  Nothing Worsened by:  Nothing Chest Pain      Past Medical History:  Diagnosis Date  . High cholesterol   . Prediabetes     Patient Active Problem List   Diagnosis Date Noted  . 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 BREAST EXCISIONAL BIOPSY Left 90s   benign  . left shoulder surgery    . PLANTAR FASCIA SURGERY Left   . TUBAL LIGATION  1990  . uterine ablation    . WRIST SURGERY Right      OB History   No obstetric history on file.     Family History  Problem Relation Age of Onset  . Heart attack Mother   . Stroke Mother   . Dementia Father   . Heart attack Father    . Stroke Father   . Migraines Nephew     Social History   Tobacco Use  . Smoking status: Current Every Day Smoker    Types: Cigarettes    Start date: 2008  . Smokeless tobacco: Never Used  . Tobacco comment: increased to 1 pack per week in the last year  Vaping Use  . Vaping Use: Never used  Substance Use Topics  . Alcohol use: Yes    Alcohol/week: 1.0 - 2.0 standard drink    Types: 1 - 2 Glasses of wine per week    Comment: socially, not every weekend   . Drug use: Not Currently    Comment: smoked marijuana briefly in her 94s     Home Medications Prior to Admission medications   Medication Sig Start Date End Date Taking? Authorizing Provider  APPLE CIDER VINEGAR PO Take by mouth. gummies    [provider]  Ascorbic Acid (VITAMIN C PO) Take by mouth.    [provider]  BIOTIN PO Take by mouth.    [provider]  Cholecalciferol (VITAMIN D3 PO) Take 2,000 Int'l Units by mouth.    [provider]  co-enzyme Q-10 50 MG capsule Take 50 mg by mouth daily.  [provider]  ibuprofen (ADVIL,MOTRIN) 600 MG tablet Take 600 mg by mouth as needed.  05/03/18   [provider]  loratadine (CLARITIN) 10 MG tablet Take by mouth.    [provider]  Multiple Vitamin (MULTIVITAMIN) capsule Take by mouth.    [provider]  nystatin-triamcinolone (MYCOLOG II) cream Apply 1 application topically as needed.     [provider]  Omega-3 Fatty Acids (FISH OIL) 1000 MG CAPS Take by mouth.    [provider]  omeprazole (PRILOSEC) 20 MG capsule Take 20 mg by mouth daily. 07/26/18   [provider]  vitamin B-12 (CYANOCOBALAMIN) 100 MCG tablet Take 200 mcg by mouth.     [provider]    Allergies    Patient has no known allergies.  Review of Systems   Review of Systems  Cardiovascular: Positive for chest pain.  All other systems reviewed and are negative.   Physical  Exam Updated Vital Signs BP (!) 156/115 (BP Location: Left Arm)   Pulse 87   Temp 98.6 F (37 C) (Oral)   Resp 11   Ht 5\' 7"  (1.702 m)   Wt 117.9 kg   SpO2 98%   BMI 40.72 kg/m   Physical Exam Vitals and nursing note reviewed.  Constitutional:      General: She is not in acute distress.    Appearance: She is well-developed and well-nourished.  HENT:     Head: Normocephalic and atraumatic.     Mouth/Throat:     Mouth: Oropharynx is clear and moist.  Eyes:     Extraocular Movements: EOM normal.     Conjunctiva/sclera: Conjunctivae normal.     Pupils: Pupils are equal, round, and reactive to light.  Cardiovascular:     Rate and Rhythm: Normal rate and regular rhythm.     Heart sounds: Normal heart sounds.  Pulmonary:     Effort: Pulmonary effort is normal. No respiratory distress.     Breath sounds: Normal breath sounds.  Abdominal:     General: There is no distension.     Palpations: Abdomen is soft.     Tenderness: There is no abdominal tenderness.  Musculoskeletal:        General: No deformity. Normal range of motion.     Cervical back: Normal range of motion and neck supple.     Right lower leg: No tenderness. No edema.  Skin:    General: Skin is warm and dry.  Neurological:     Mental Status: She is alert and oriented to person, place, and time.  Psychiatric:        Mood and Affect: Mood and affect normal.     ED Results / Procedures / Treatments   Labs (all labs ordered are listed, but only abnormal results are displayed) Labs Reviewed  BASIC METABOLIC PANEL - Abnormal; Notable for the following components:      Result Value   Glucose, Bld 103 (*)    Creatinine, Ser 1.03 (*)    All other components within normal limits  CBC - Abnormal; Notable for the following components:   RBC 3.75 (*)    Hemoglobin 11.7 (*)    All other components within normal limits  I-STAT BETA HCG BLOOD, ED ([facility], [facility], [facility] ONLY)  TROPONIN I (HIGH SENSITIVITY)  TROPONIN I (HIGH  SENSITIVITY)    EKG EKG Interpretation  Date/Time:  Monday September 24 2020 17:16:48 EST Ventricular Rate:  83 PR Interval:    QRS  Duration: 89 QT Interval:  385 QTC Calculation: 453 R Axis:   74 Text Interpretation: Sinus rhythm 12 Lead; Mason-Likar Confirmed by Kristine Royal [phone]) on 09/24/2020 8:49:30 PM   Radiology DG Chest 2 View  Result Date: 09/24/2020 CLINICAL DATA:  Sternal pain. Right lower extremity pain since Saturday. EXAM: CHEST - 2 VIEW COMPARISON:  December 20, 2010 FINDINGS: The heart size and mediastinal contours are within normal limits. Both lungs are clear. The visualized skeletal structures are unremarkable. IMPRESSION: No active cardiopulmonary disease. Electronically Signed   By: Katherine Mantle M.D.   On: 09/24/2020 17:51    Procedures Procedures   Medications Ordered in ED Medications - No data to display  ED Course  I have reviewed the triage vital signs and the nursing notes.  Pertinent labs & imaging results that were available during my care of the patient were reviewed by me and considered in my medical decision making (see chart for details).    MDM Rules/Calculators/A&P                          MDM  Screen complete  Karina Brooks was evaluated in Emergency Department on 09/24/2020 for the symptoms described in the history of present illness. She was evaluated in the context of the global COVID-19 pandemic, which necessitated consideration that the patient might be at risk for infection with the SARS-CoV-2 virus that causes COVID-19. Institutional protocols and algorithms that pertain to the evaluation of patients at risk for COVID-19 are in a state of rapid change based on information released by regulatory bodies including the CDC and federal and state organizations. These policies and algorithms were followed during the patient's care in the ED.  Patient is presenting with complaints of atypical chest pain and right lower leg discomfort.    Patient is described chest discomfort is atypical in nature.  Patient's ongoing symptoms have been present for the last week.  EKG is without acute ischemia.  Troponin is nearly undetectable.  Patient's leg discomfort is most consistent with likely muscular strain.  Patient is arranged for outpatient [place] tomorrow to RO DVT.  Does understand need for close follow-up.  Strict return precautions given and understood.  Final Clinical Impression(s) / ED Diagnoses Final diagnoses:  Right leg pain    Rx / DC Orders ED Discharge Orders         Ordered    LE VENOUS       Comments: RLE pain - RO DVT   09/24/20 2124           Wynetta Fines, MD 09/24/20 2229

## 2020-09-24 NOTE — Discharge Instructions (Addendum)
Please return for any problem.  Please use Ibuprofen - 600 mg every 8 hours and/or Acetaminophen - 1000mg  every 8 hours for pain.   You are scheduled for an outpatient vascular study tomorrow at [facility] to further evaluate your reported leg pain.

## 2020-09-24 NOTE — ED Triage Notes (Signed)
Pt c/o RLE pain since Saturday, describes it as a constant, throbbing pain. Denies injury, redness or swelling. Pt states she recently went to PCP for cp, was referred to cardiologist, but never received call. Pt c/o sternal pain/soreness. Is vague on answering if she has chest pain

## 2020-09-25 ENCOUNTER — Ambulatory Visit (HOSPITAL_COMMUNITY)
Admission: RE | Admit: 2020-09-25 | Discharge: 2020-09-25 | Disposition: A | Discharge status: Home / Self Care | Payer: Managed Care, Other (non HMO) | Source: Ambulatory Visit | Attending: Emergency Medicine | Admitting: Emergency Medicine

## 2020-09-25 DIAGNOSIS — M79604 Pain in right leg: Secondary | ICD-10-CM

## 2020-09-25 DIAGNOSIS — M79661 Pain in right lower leg: Secondary | ICD-10-CM | POA: Insufficient documentation

## 2020-09-25 NOTE — Progress Notes (Signed)
Right lower extremity venous study completed.     Please see CV Proc for preliminary results.   Denzil Bristol, RVT  

## 2022-01-22 ENCOUNTER — Ambulatory Visit
Admission: EM | Admit: 2022-01-22 | Discharge: 2022-01-22 | Disposition: A | Discharge status: Home / Self Care | Payer: Commercial Managed Care - HMO | Attending: Emergency Medicine | Admitting: Emergency Medicine

## 2022-01-22 DIAGNOSIS — R062 Wheezing: Secondary | ICD-10-CM

## 2022-01-22 DIAGNOSIS — J309 Allergic rhinitis, unspecified: Secondary | ICD-10-CM

## 2022-01-22 DIAGNOSIS — R059 Cough, unspecified: Secondary | ICD-10-CM

## 2022-01-22 MED ORDER — FLUTICASONE PROPIONATE 50 MCG/ACT NA SUSP: 1.0000 | Freq: Every day | NASAL | 1 refills | Status: AC

## 2022-01-22 MED ORDER — LEVOCETIRIZINE DIHYDROCHLORIDE 5 MG PO TABS: 5.0000 mg | ORAL_TABLET | Freq: Every evening | ORAL | 1 refills | Status: AC

## 2022-01-22 MED ORDER — ALBUTEROL SULFATE HFA 108 (90 BASE) MCG/ACT IN AERS: 2.0000 | INHALATION_SPRAY | Freq: Four times a day (QID) | RESPIRATORY_TRACT | 0 refills | Status: AC | PRN

## 2022-01-22 MED ORDER — TRIAMCINOLONE ACETONIDE 40 MG/ML IJ SUSP
60.0000 mg | Freq: Once | INTRAMUSCULAR | Status: AC
  Administered 2022-01-22: 60 mg via INTRAMUSCULAR

## 2022-01-22 MED ORDER — ALBUTEROL SULFATE (2.5 MG/3ML) 0.083% IN NEBU
2.5000 mg | INHALATION_SOLUTION | Freq: Once | RESPIRATORY_TRACT | Status: AC
  Administered 2022-01-22: 2.5 mg via RESPIRATORY_TRACT

## 2022-01-22 NOTE — ED Provider Notes (Signed)
[facility]    CSN: [id number] Arrival date & time: 01/22/22  1226    HISTORY   Chief Complaint  Patient presents with   Cough   Nasal Congestion   Shortness of Breath   HPI Karina Brooks is a 61 y.o. female. Patient complains of a nonproductive cough, chest congestion, shortness of breath and perceived wheezing that began about a week ago.  Per EMR, patient had a similar episode in November 2022, was seen by a [facility] provider who advised her to continue taking Xyzal and begin Flonase nasal spray, patient states not currently taking any medication for allergies.  The history is provided by the patient.   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    Home Medications    Prior to Admission medications   Medication Sig Start Date End Date Taking? Authorizing Provider  APPLE CIDER VINEGAR PO Take by mouth. gummies    [provider]  Ascorbic Acid (VITAMIN C PO) Take by mouth.    [provider]  BIOTIN PO Take by mouth.    [provider]  Cholecalciferol (VITAMIN D3 PO) Take 2,000 Int'l Units by mouth.    [provider]  co-enzyme Q-10 50 MG capsule Take 50 mg by mouth daily.     [provider]  ibuprofen (ADVIL,MOTRIN) 600 MG tablet Take 600 mg by mouth as needed.  05/03/18   [provider]  loratadine (CLARITIN) 10 MG tablet Take by mouth.    [provider]  Multiple Vitamin (MULTIVITAMIN) capsule Take by mouth.    [provider]   nystatin-triamcinolone (MYCOLOG II) cream Apply 1 application topically as needed.     [provider]  Omega-3 Fatty Acids (FISH OIL) 1000 MG CAPS Take by mouth.    [provider]  omeprazole (PRILOSEC) 20 MG capsule Take 20 mg by mouth daily. 07/26/18   [provider]  vitamin B-12 (CYANOCOBALAMIN) 100 MCG tablet Take 200 mcg by mouth.     [provider]   Family History Family History  Problem Relation Age of Onset   Heart attack Mother    Stroke Mother    Dementia Father    Heart attack Father    Stroke Father    Migraines Nephew    Social History Social History   Tobacco Use   Smoking status: Every Day    Types: Cigarettes    Start date: 2008   Smokeless tobacco: Never   Tobacco comments:    increased to 1 pack per week in the last year  Vaping Use   Vaping Use: Never used  Substance Use Topics   Alcohol use: Yes    Alcohol/week: 1.0 - 2.0 standard drink of alcohol    Types: 1 - 2 Glasses of wine per week    Comment: socially, not every weekend    Drug use: Not Currently    Comment: smoked marijuana briefly in  her 37s    Allergies   Simvastatin  Review of Systems Review of Systems Pertinent findings noted in history of present illness.   Physical Exam Triage Vital Signs ED Triage Vitals  Enc Vitals Group     BP 05/17/21 0827 (!) 147/82     Pulse Rate 05/17/21 0827 72     Resp 05/17/21 0827 18     Temp 05/17/21 0827 98.3 F (36.8 C)     Temp Source 05/17/21 0827 Oral     SpO2 05/17/21 0827 98 %     Weight --      Height --      Head Circumference --      Peak Flow --      Pain Score 05/17/21 0826 5     Pain Loc --      Pain Edu? --      Excl. in GC? --   No data found.  Updated Vital Signs BP 128/78 (BP Location: Right Arm)   Pulse 68   Temp 98.5 F (36.9 C) (Oral)   Resp 18   SpO2 96%   Physical Exam Vitals and nursing note reviewed.  Constitutional:      General: She is not in acute distress.     Appearance: Normal appearance. She is not ill-appearing.  HENT:     Head: Normocephalic and atraumatic.     Salivary Glands: Right salivary gland is not diffusely enlarged or tender. Left salivary gland is not diffusely enlarged or tender.     Right Ear: Ear canal and external ear normal. No drainage. A middle ear effusion is present. There is no impacted cerumen. Tympanic membrane is bulging. Tympanic membrane is not injected or erythematous.     Left Ear: Ear canal and external ear normal. No drainage. A middle ear effusion is present. There is no impacted cerumen. Tympanic membrane is bulging. Tympanic membrane is not injected or erythematous.     Ears:     Comments: Bilateral EACs normal, both TMs bulging with clear fluid    Nose: Rhinorrhea present. No nasal deformity, septal deviation, signs of injury, nasal tenderness, mucosal edema or congestion. Rhinorrhea is clear.     Right Nostril: Occlusion present. No foreign body, epistaxis or septal hematoma.     Left Nostril: Occlusion present. No foreign body, epistaxis or septal hematoma.     Right Turbinates: Enlarged, swollen and pale.     Left Turbinates: Enlarged, swollen and pale.     Right Sinus: No maxillary sinus tenderness or frontal sinus tenderness.     Left Sinus: No maxillary sinus tenderness or frontal sinus tenderness.     Mouth/Throat:     Lips: Pink. No lesions.     Mouth: Mucous membranes are moist. No oral lesions.     Pharynx: Oropharynx is clear. Uvula midline. No posterior oropharyngeal erythema or uvula swelling.     Tonsils: No tonsillar exudate. 0 on the right. 0 on the left.     Comments: Postnasal drip Eyes:     General: Lids are normal.        Right eye: No discharge.        Left eye: No discharge.     Extraocular Movements: Extraocular movements intact.     Conjunctiva/sclera: Conjunctivae normal.     Right eye: Right conjunctiva is not injected.     Left eye: Left conjunctiva is not injected.  Neck:      Trachea: Trachea and phonation normal.  Cardiovascular:  Rate and Rhythm: Normal rate and regular rhythm.     Pulses: Normal pulses.     Heart sounds: Normal heart sounds. No murmur heard.    No friction rub. No gallop.  Pulmonary:     Effort: Pulmonary effort is normal. No tachypnea, bradypnea, accessory muscle usage, prolonged expiration, respiratory distress or retractions.     Breath sounds: No stridor, decreased air movement or transmitted upper airway sounds. Examination of the right-middle field reveals wheezing. Examination of the left-middle field reveals wheezing. Examination of the right-lower field reveals wheezing. Examination of the left-lower field reveals wheezing. Wheezing present. No decreased breath sounds, rhonchi or rales.  Chest:     Chest wall: No tenderness.  Musculoskeletal:        General: Normal range of motion.     Cervical back: Normal range of motion and neck supple. Normal range of motion.  Lymphadenopathy:     Cervical: No cervical adenopathy.  Skin:    General: Skin is warm and dry.     Findings: No erythema or rash.  Neurological:     General: No focal deficit present.     Mental Status: She is alert and oriented to person, place, and time.  Psychiatric:        Mood and Affect: Mood normal.        Behavior: Behavior normal.     Visual Acuity Right Eye Distance:   Left Eye Distance:   Bilateral Distance:    Right Eye Near:   Left Eye Near:    Bilateral Near:     UC Couse / Diagnostics / Procedures:    EKG  Radiology No results found.  Procedures Procedures (including critical care time)  UC Diagnoses / Final Clinical Impressions(s)   I have reviewed the triage vital signs and the nursing notes.  Pertinent labs & imaging results that were available during my care of the patient were reviewed by me and considered in my medical decision making (see chart for details).   Final diagnoses:  Allergic rhinitis, unspecified seasonality,  unspecified trigger  Cough, unspecified type   Patient noncompliant with regular allergy medication usage, patient advised to take daily to prevent future exacerbations such as this 1.  Patient provided with albuterol neb during her visit today which resulted in remarkable improvement of cough.  Patient was also provided with injection of Kenalog given presence of wheezing on exam.  Patient advised to resume Xyzal and Flonase as had previously been prescribed.  Prescription sent to pharmacy.  Return precautions advised.  ED Prescriptions     Medication Sig Dispense Auth. Provider   levocetirizine (XYZAL) 5 MG tablet Take 1 tablet (5 mg total) by mouth every evening. 90 tablet Theadora Rama Scales, PA-C   fluticasone (FLONASE) 50 MCG/ACT nasal spray Place 1 spray into both nostrils daily. 47.4 mL Theadora Rama Scales, PA-C   albuterol (VENTOLIN HFA) 108 (90 Base) MCG/ACT inhaler Inhale 2 puffs into the lungs every 6 (six) hours as needed for wheezing or shortness of breath (Cough). 18 g Theadora Rama Scales, PA-C      PDMP not reviewed this encounter.  Pending results:  Labs Reviewed - No data to display  Medications Ordered in UC: Medications  triamcinolone acetonide (KENALOG-40) injection 60 mg (has no administration in time range)  albuterol (PROVENTIL) (2.5 MG/3ML) 0.083% nebulizer solution 2.5 mg (2.5 mg Nebulization Given 01/22/22 1259)    Disposition Upon Discharge:  Condition: stable for discharge home Home: take medications as prescribed; routine  discharge instructions as discussed; follow up as advised.  Patient presented with an acute illness with associated systemic symptoms and significant discomfort requiring urgent management. In my opinion, this is a condition that a prudent lay person (someone who possesses an average knowledge of health and medicine) may potentially expect to result in complications if not addressed urgently such as respiratory distress, impairment  of bodily function or dysfunction of bodily organs.   Routine symptom specific, illness specific and/or disease specific instructions were discussed with the patient and/or caregiver at length.   As such, the patient has been evaluated and assessed, work-up was performed and treatment was provided in alignment with urgent care protocols and evidence based medicine.  Patient/parent/caregiver has been advised that the patient may require follow up for further testing and treatment if the symptoms continue in spite of treatment, as clinically indicated and appropriate.  If the patient was tested for COVID-19, Influenza and/or RSV, then the patient/parent/guardian was advised to isolate at home pending the results of his/her diagnostic coronavirus test and potentially longer if they're positive. I have also advised pt that if his/her COVID-19 test returns positive, it's recommended to self-isolate for at least 10 days after symptoms first appeared AND until fever-free for 24 hours without fever reducer AND other symptoms have improved or resolved. Discussed self-isolation recommendations as well as instructions for household member/close contacts as per the [facility] and [place] [facility], and also gave patient the COVID packet with this information.  Patient/parent/caregiver has been advised to return to the [facility] or PCP in 3-5 days if no better; to PCP or the Emergency Department if new signs and symptoms develop, or if the current signs or symptoms continue to change or worsen for further workup, evaluation and treatment as clinically indicated and appropriate  The patient will follow up with their current PCP if and as advised. If the patient does not currently have a PCP we will assist them in obtaining one.   The patient may need specialty follow up if the symptoms continue, in spite of conservative treatment and management, for further workup, evaluation, consultation and treatment as clinically indicated and  appropriate.  Patient/parent/caregiver verbalized understanding and agreement of plan as discussed.  All questions were addressed during visit.  Please see discharge instructions below for further details of plan.  Discharge Instructions:   Discharge Instructions      Your symptoms and my physical exam findings are concerning for exacerbation of your underlying allergies.     Please see the list below for recommended medications, dosages and frequencies to provide relief of current symptoms:     Kenalog IM (triamcinolone):  To quickly address your significant respiratory inflammation, you were provided with an injection of Kenalog in the office today.  You should continue to feel the full benefit of the steroid for the next 24-36 hours.    Xyzal (levocetirizine): This is an excellent second-generation antihistamine that helps to reduce respiratory inflammatory response to environmental allergens.  In some patients, this medication can cause daytime sleepiness so I recommend that you take 1 tablet daily at bedtime.     Flonase (fluticasone): This is a steroid nasal spray that you use once daily, 1 spray in each nare.  This medication does not work well if you decide to use it only used as you feel you need to, it works best used on a daily basis.  After 3 to 5 days of use, you will notice significant reduction of the inflammation and mucus  production that is currently being caused by exposure to allergens, whether seasonal or environmental.  The most common side effect of this medication is nosebleeds.  If you experience a nosebleed, please discontinue use for 1 week, then feel free to resume.  I have provided you with a prescription.     ProAir, Ventolin, Proventil (albuterol): This inhaled medication contains a short acting beta agonist bronchodilator.  This medication works on the smooth muscle that opens and constricts of your airways by relaxing the muscle.  The result of relaxation of the  smooth muscle is increased air movement and improved work of breathing.  This is a short acting medication that can be used every 4-6 hours as needed for increased work of breathing, shortness of breath, wheezing and excessive coughing.  I have provided you with a prescription.    If your insurance will not cover your allergy medications, please consider downloading the Good Rx app which is free.  You can find considerable discounts on prescription and over-the-counter medications.   Please follow-up within the next 5-7 days either with your primary care provider or urgent care if your symptoms do not resolve.  If you do not have a primary care provider, we will assist you in finding one.   Thank you for visiting urgent care today.  We appreciate the opportunity to participate in your care.       This office note has been dictated using Teaching laboratory technician.  Unfortunately, and despite my best efforts, this method of dictation can sometimes lead to occasional typographical or grammatical errors.  I apologize in advance if this occurs.     Theadora Rama Scales, PA-C 01/22/22 1317

## 2022-01-22 NOTE — Discharge Instructions (Signed)
Your symptoms and my physical exam findings are concerning for exacerbation of your underlying allergies.     Please see the list below for recommended medications, dosages and frequencies to provide relief of current symptoms:     Kenalog IM (triamcinolone):  To quickly address your significant respiratory inflammation, you were provided with an injection of Kenalog in the office today.  You should continue to feel the full benefit of the steroid for the next 24-36 hours.    Xyzal (levocetirizine): This is an excellent second-generation antihistamine that helps to reduce respiratory inflammatory response to environmental allergens.  In some patients, this medication can cause daytime sleepiness so I recommend that you take 1 tablet daily at bedtime.     Flonase (fluticasone): This is a steroid nasal spray that you use once daily, 1 spray in each nare.  This medication does not work well if you decide to use it only used as you feel you need to, it works best used on a daily basis.  After 3 to 5 days of use, you will notice significant reduction of the inflammation and mucus production that is currently being caused by exposure to allergens, whether seasonal or environmental.  The most common side effect of this medication is nosebleeds.  If you experience a nosebleed, please discontinue use for 1 week, then feel free to resume.  I have provided you with a prescription.     ProAir, Ventolin, Proventil (albuterol): This inhaled medication contains a short acting beta agonist bronchodilator.  This medication works on the smooth muscle that opens and constricts of your airways by relaxing the muscle.  The result of relaxation of the smooth muscle is increased air movement and improved work of breathing.  This is a short acting medication that can be used every 4-6 hours as needed for increased work of breathing, shortness of breath, wheezing and excessive coughing.  I have provided you with a prescription.     If your insurance will not cover your allergy medications, please consider downloading the Good Rx app which is free.  You can find considerable discounts on prescription and over-the-counter medications.   Please follow-up within the next 5-7 days either with your primary care provider or urgent care if your symptoms do not resolve.  If you do not have a primary care provider, we will assist you in finding one.   Thank you for visiting urgent care today.  We appreciate the opportunity to participate in your care.

## 2022-01-22 NOTE — ED Triage Notes (Signed)
Pt c/o cough, chest congestion, SOB and wheezing.   Started: a week ago  Home interventions: robitussin

## 2022-01-27 ENCOUNTER — Ambulatory Visit
Admission: EM | Admit: 2022-01-27 | Discharge: 2022-01-27 | Disposition: A | Discharge status: Home / Self Care | Payer: Commercial Managed Care - HMO | Attending: Emergency Medicine | Admitting: Emergency Medicine

## 2022-01-27 DIAGNOSIS — R053 Chronic cough: Secondary | ICD-10-CM

## 2022-01-27 DIAGNOSIS — J209 Acute bronchitis, unspecified: Secondary | ICD-10-CM | POA: Diagnosis not present

## 2022-01-27 DIAGNOSIS — J309 Allergic rhinitis, unspecified: Secondary | ICD-10-CM

## 2022-01-27 MED ORDER — GUAIFENESIN 400 MG PO TABS: ORAL_TABLET | ORAL | 0 refills | Status: AC

## 2022-01-27 MED ORDER — IPRATROPIUM BROMIDE 0.06 % NA SOLN: 2.0000 | Freq: Three times a day (TID) | NASAL | 1 refills | Status: AC

## 2022-01-27 MED ORDER — PROMETHAZINE-DM 6.25-15 MG/5ML PO SYRP: 5.0000 mL | ORAL_SOLUTION | Freq: Four times a day (QID) | ORAL | 0 refills | Status: AC | PRN

## 2022-01-27 NOTE — ED Triage Notes (Signed)
Pt c/o cough, & chest congestion.  Started: a week ago and a half ago    Home interventions: robitussin, Flonase

## 2022-01-27 NOTE — Discharge Instructions (Addendum)
Your symptoms and physical exam findings are concerning for a viral respiratory infection.     Please see the list below for recommended medications, dosages and frequencies to provide relief of your current symptoms:   Xyzal (levocetirizine): This is an excellent second-generation antihistamine that helps to reduce respiratory inflammatory response to environmental allergens.  In some patients, this medication can cause daytime sleepiness so I recommend that you take 1 tablet daily at bedtime.     Flonase (fluticasone): This is a steroid nasal spray that you use once daily, 1 spray in each nare.  This medication does not work well if you decide to use it only used as you feel you need to, it works best used on a daily basis.  After 3 to 5 days of use, you will notice significant reduction of the inflammation and mucus production that is currently being caused by exposure to allergens, whether seasonal or environmental.  The most common side effect of this medication is nosebleeds.  If you experience a nosebleed, please discontinue use for 1 week, then feel free to resume.  I have provided you with a prescription.     Atrovent (ipratropium): This is an excellent nasal decongestant spray I have added to your recommended nasal steroid that will not cause rebound congestion, please instill 2 sprays into each nare with each use.  Because nasal steroids can take several days before they begin to provide full benefit, I recommend that you use this spray in addition to the nasal steroid prescribed for you.  Please use it after you have used your nasal steroid and repeat up to 4 times daily as needed.  I have provided you with a prescription for this medication.      ProAir, Ventolin, Proventil (albuterol): This inhaled medication contains a short acting beta agonist bronchodilator.  This medication works on the smooth muscle that opens and constricts of your airways by relaxing the muscle.  The result of  relaxation of the smooth muscle is increased air movement and improved work of breathing.  This is a short acting medication that can be used every 4-6 hours as needed for increased work of breathing, shortness of breath, wheezing and excessive coughing.  I have provided you with a prescription.    Advil, Motrin (ibuprofen): This is a good anti-inflammatory medication which addresses aches, pains and inflammation of the upper airways that causes sinus and nasal congestion as well as in the lower airways which makes your cough feel tight and sometimes burn.  I recommend that you take between 400 to 600 mg every 6-8 hours as needed.      Tylenol (acetaminophen): This is a good fever reducer.  If your body temperature rises above 101.5 as measured with a thermometer, it is recommended that you take 1,000 mg every 8 hours until your temperature falls below 101.5, please not take more than 3,000 mg of acetaminophen either as a separate medication or as in ingredient in an over-the-counter cold/flu preparation within a 24-hour period.      Robitussin, Mucinex (guaifenesin): This is an expectorant.  This helps break up chest congestion and loosen up thick nasal drainage making phlegm and drainage more liquid and therefore easier to remove.  I recommend being 400 mg three times daily as needed.      Promethazine DM: Promethazine is both a nasal decongestant and an antinausea medication that makes most patients feel fairly sleepy.  The DM is dextromethorphan, a cough suppressant found in many over-the-counter  cough medications.  Please take 5 mL before bedtime to minimize your cough which will help you sleep better.  I have sent a prescription for this medication to your pharmacy.   Please follow-up within the next 5-7 days either with your primary care provider or urgent care if your symptoms do not resolve.  If you do not have a primary care provider, we will assist you in finding one.   Thank you for visiting  urgent care today.  We appreciate the opportunity to participate in your care.

## 2022-01-27 NOTE — ED Provider Notes (Signed)
[facility]    CSN: [id number] Arrival date & time: 01/27/22  1914    HISTORY   Chief Complaint  Patient presents with   Cough   HPI Karina Brooks is a 61 y.o. female. Patient presents to urgent care complaining of a 10-day of nonproductive cough, congestion in her chest.  Patient has normal vital signs on arrival today and is otherwise well-appearing.  Patient denies known sick contacts.  Patient denies headache, fever, chills, nausea, vomiting, diarrhea, body ache, sore throat.  The history is provided by the patient.   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    Home Medications    Prior to Admission medications   Medication Sig Start Date End Date Taking? Authorizing Provider  albuterol (VENTOLIN HFA) 108 (90 Base) MCG/ACT inhaler Inhale 2 puffs into the lungs every 6 (six) hours as needed for wheezing or shortness of breath (Cough). 01/22/22   Theadora Rama Scales, PA-C  APPLE CIDER VINEGAR PO Take by mouth. gummies    [provider]  Ascorbic Acid (VITAMIN C PO) Take by mouth.    [provider]  BIOTIN PO Take by mouth.    [provider]  Cholecalciferol (VITAMIN D3 PO) Take 2,000 Int'l Units by mouth.    [provider]  co-enzyme Q-10 50 MG capsule Take 50 mg by mouth daily.     [provider]  fluticasone (FLONASE) 50 MCG/ACT nasal spray Place 1 spray into both nostrils daily. 01/22/22   Theadora Rama Scales, PA-C  ibuprofen (ADVIL,MOTRIN) 600 MG tablet Take  600 mg by mouth as needed.  05/03/18   [provider]  levocetirizine (XYZAL) 5 MG tablet Take 1 tablet (5 mg total) by mouth every evening. 01/22/22 07/21/22  Theadora Rama Scales, PA-C  loratadine (CLARITIN) 10 MG tablet Take by mouth.    [provider]  Multiple Vitamin (MULTIVITAMIN) capsule Take by mouth.    [provider]  nystatin-triamcinolone (MYCOLOG II) cream Apply 1 application topically as needed.     [provider]  Omega-3 Fatty Acids (FISH OIL) 1000 MG CAPS Take by mouth.    [provider]  omeprazole (PRILOSEC) 20 MG capsule Take 20 mg by mouth daily. 07/26/18   [provider]  vitamin B-12 (CYANOCOBALAMIN) 100 MCG tablet Take 200 mcg by mouth.     [provider]   Family History Family History  Problem Relation Age of Onset   Heart attack Mother    Stroke Mother    Dementia Father    Heart attack Father    Stroke Father    Migraines Nephew    Social History Social History   Tobacco Use   Smoking status: Every Day    Types: Cigarettes    Start date: 2008   Smokeless tobacco: Never   Tobacco comments:    increased to 1 pack per week in the last year  Vaping Use  Vaping Use: Never used  Substance Use Topics   Alcohol use: Yes    Alcohol/week: 1.0 - 2.0 standard drink of alcohol    Types: 1 - 2 Glasses of wine per week    Comment: socially, not every weekend    Drug use: Not Currently    Comment: smoked marijuana briefly in her 40s    Allergies   Simvastatin  Review of Systems Review of Systems Pertinent findings noted in history of present illness.   Physical Exam Triage Vital Signs ED Triage Vitals  Enc Vitals Group     BP 05/17/21 0827 (!) 147/82     Pulse Rate 05/17/21 0827 72     Resp 05/17/21 0827 18     Temp 05/17/21 0827 98.3 F (36.8 C)     Temp Source 05/17/21 0827 Oral     SpO2 05/17/21 0827 98 %     Weight --      Height --      Head Circumference --      Peak Flow  --      Pain Score 05/17/21 0826 5     Pain Loc --      Pain Edu? --      Excl. in GC? --   No data found.  Updated Vital Signs BP 130/72 (BP Location: Right Arm)   Pulse 87   Temp 98.8 F (37.1 C) (Oral)   Resp 18   SpO2 96%   Physical Exam Vitals and nursing note reviewed.  Constitutional:      General: She is not in acute distress.    Appearance: Normal appearance. She is not ill-appearing.  HENT:     Head: Normocephalic and atraumatic.     Salivary Glands: Right salivary gland is not diffusely enlarged or tender. Left salivary gland is not diffusely enlarged or tender.     Right Ear: Ear canal and external ear normal. No drainage. A middle ear effusion is present. There is no impacted cerumen. Tympanic membrane is bulging. Tympanic membrane is not injected or erythematous.     Left Ear: Ear canal and external ear normal. No drainage. A middle ear effusion is present. There is no impacted cerumen. Tympanic membrane is bulging. Tympanic membrane is not injected or erythematous.     Ears:     Comments: Bilateral EACs normal, both TMs bulging with clear fluid    Nose: Rhinorrhea present. No nasal deformity, septal deviation, signs of injury, nasal tenderness, mucosal edema or congestion. Rhinorrhea is clear.     Right Nostril: Occlusion present. No foreign body, epistaxis or septal hematoma.     Left Nostril: Occlusion present. No foreign body, epistaxis or septal hematoma.     Right Turbinates: Enlarged, swollen and pale.     Left Turbinates: Enlarged, swollen and pale.     Right Sinus: No maxillary sinus tenderness or frontal sinus tenderness.     Left Sinus: No maxillary sinus tenderness or frontal sinus tenderness.     Mouth/Throat:     Lips: Pink. No lesions.     Mouth: Mucous membranes are moist. No oral lesions.     Pharynx: Oropharynx is clear. Uvula midline. No posterior oropharyngeal erythema or uvula swelling.     Tonsils: No tonsillar exudate. 0 on the right. 0 on  the left.     Comments: Postnasal drip Eyes:     General: Lids are normal.        Right eye: No discharge.  Left eye: No discharge.     Extraocular Movements: Extraocular movements intact.     Conjunctiva/sclera: Conjunctivae normal.     Right eye: Right conjunctiva is not injected.     Left eye: Left conjunctiva is not injected.  Neck:     Trachea: Trachea and phonation normal.  Cardiovascular:     Rate and Rhythm: Normal rate and regular rhythm.     Pulses: Normal pulses.     Heart sounds: Normal heart sounds. No murmur heard.    No friction rub. No gallop.  Pulmonary:     Effort: Pulmonary effort is normal. No tachypnea, bradypnea, accessory muscle usage, prolonged expiration, respiratory distress or retractions.     Breath sounds: Normal breath sounds and air entry. No stridor, decreased air movement or transmitted upper airway sounds. No decreased breath sounds, wheezing, rhonchi or rales.     Comments: Acute bronchospasm with cough Chest:     Chest wall: No tenderness.  Musculoskeletal:        General: Normal range of motion.     Cervical back: Normal range of motion and neck supple. Normal range of motion.  Lymphadenopathy:     Cervical: No cervical adenopathy.  Skin:    General: Skin is warm and dry.     Findings: No erythema or rash.  Neurological:     General: No focal deficit present.     Mental Status: She is alert and oriented to person, place, and time.  Psychiatric:        Mood and Affect: Mood normal.        Behavior: Behavior normal.     Visual Acuity Right Eye Distance:   Left Eye Distance:   Bilateral Distance:    Right Eye Near:   Left Eye Near:    Bilateral Near:     UC Couse / Diagnostics / Procedures:    EKG  Radiology No results found.  Procedures Procedures (including critical care time)  UC Diagnoses / Final Clinical Impressions(s)   I have reviewed the triage vital signs and the nursing notes.  Pertinent labs & imaging  results that were available during my care of the patient were reviewed by me and considered in my medical decision making (see chart for details).   Final diagnoses:  Acute bronchitis, unspecified organism  Allergic rhinitis with postnasal drip  Persistent cough   Patient advised to physical exam findings.  Patient provided with Atrovent nasal spray and advised to use all allergy medications as before.  Patient provided with a cough medicine for nighttime and advised to begin Mucinex and daytime to help expectorate.  Return precautions advised.  ED Prescriptions     Medication Sig Dispense Auth. Provider   ipratropium (ATROVENT) 0.06 % nasal spray Place 2 sprays into both nostrils 3 (three) times daily. As needed for nasal congestion, runny nose 15 mL Theadora RamaMorgan, Aulden Calise Scales, PA-C   promethazine-dextromethorphan (PROMETHAZINE-DM) 6.25-15 MG/5ML syrup Take 5 mLs by mouth 4 (four) times daily as needed for cough. 118 mL Theadora RamaMorgan, Kalei Mckillop Scales, PA-C   guaifenesin (HUMIBID E) 400 MG TABS tablet Take 1 tablet 3 times daily as needed for chest congestion and cough 21 tablet Theadora RamaMorgan, Fabyan Loughmiller Scales, PA-C      PDMP not reviewed this encounter.  Pending results:  Labs Reviewed - No data to display  Medications Ordered in UC: Medications - No data to display  Disposition Upon Discharge:  Condition: stable for discharge home Home: take medications as prescribed; routine discharge instructions  as discussed; follow up as advised.  Patient presented with an acute illness with associated systemic symptoms and significant discomfort requiring urgent management. In my opinion, this is a condition that a prudent lay person (someone who possesses an average knowledge of health and medicine) may potentially expect to result in complications if not addressed urgently such as respiratory distress, impairment of bodily function or dysfunction of bodily organs.   Routine symptom specific, illness specific  and/or disease specific instructions were discussed with the patient and/or caregiver at length.   As such, the patient has been evaluated and assessed, work-up was performed and treatment was provided in alignment with urgent care protocols and evidence based medicine.  Patient/parent/caregiver has been advised that the patient may require follow up for further testing and treatment if the symptoms continue in spite of treatment, as clinically indicated and appropriate.  If the patient was tested for COVID-19, Influenza and/or RSV, then the patient/parent/guardian was advised to isolate at home pending the results of his/her diagnostic coronavirus test and potentially longer if they're positive. I have also advised pt that if his/her COVID-19 test returns positive, it's recommended to self-isolate for at least 10 days after symptoms first appeared AND until fever-free for 24 hours without fever reducer AND other symptoms have improved or resolved. Discussed self-isolation recommendations as well as instructions for household member/close contacts as per the [facility] and [place] [facility], and also gave patient the COVID packet with this information.  Patient/parent/caregiver has been advised to return to the [facility] or PCP in 3-5 days if no better; to PCP or the Emergency Department if new signs and symptoms develop, or if the current signs or symptoms continue to change or worsen for further workup, evaluation and treatment as clinically indicated and appropriate  The patient will follow up with their current PCP if and as advised. If the patient does not currently have a PCP we will assist them in obtaining one.   The patient may need specialty follow up if the symptoms continue, in spite of conservative treatment and management, for further workup, evaluation, consultation and treatment as clinically indicated and appropriate.  Patient/parent/caregiver verbalized understanding and agreement of plan as discussed.  All  questions were addressed during visit.  Please see discharge instructions below for further details of plan.  Discharge Instructions:   Discharge Instructions      Your symptoms and physical exam findings are concerning for a viral respiratory infection.     Please see the list below for recommended medications, dosages and frequencies to provide relief of your current symptoms:   Xyzal (levocetirizine): This is an excellent second-generation antihistamine that helps to reduce respiratory inflammatory response to environmental allergens.  In some patients, this medication can cause daytime sleepiness so I recommend that you take 1 tablet daily at bedtime.     Flonase (fluticasone): This is a steroid nasal spray that you use once daily, 1 spray in each nare.  This medication does not work well if you decide to use it only used as you feel you need to, it works best used on a daily basis.  After 3 to 5 days of use, you will notice significant reduction of the inflammation and mucus production that is currently being caused by exposure to allergens, whether seasonal or environmental.  The most common side effect of this medication is nosebleeds.  If you experience a nosebleed, please discontinue use for 1 week, then feel free to resume.  I have provided you with  a prescription.     Atrovent (ipratropium): This is an excellent nasal decongestant spray I have added to your recommended nasal steroid that will not cause rebound congestion, please instill 2 sprays into each nare with each use.  Because nasal steroids can take several days before they begin to provide full benefit, I recommend that you use this spray in addition to the nasal steroid prescribed for you.  Please use it after you have used your nasal steroid and repeat up to 4 times daily as needed.  I have provided you with a prescription for this medication.      ProAir, Ventolin, Proventil (albuterol): This inhaled medication contains a  short acting beta agonist bronchodilator.  This medication works on the smooth muscle that opens and constricts of your airways by relaxing the muscle.  The result of relaxation of the smooth muscle is increased air movement and improved work of breathing.  This is a short acting medication that can be used every 4-6 hours as needed for increased work of breathing, shortness of breath, wheezing and excessive coughing.  I have provided you with a prescription.    Advil, Motrin (ibuprofen): This is a good anti-inflammatory medication which addresses aches, pains and inflammation of the upper airways that causes sinus and nasal congestion as well as in the lower airways which makes your cough feel tight and sometimes burn.  I recommend that you take between 400 to 600 mg every 6-8 hours as needed.      Tylenol (acetaminophen): This is a good fever reducer.  If your body temperature rises above 101.5 as measured with a thermometer, it is recommended that you take 1,000 mg every 8 hours until your temperature falls below 101.5, please not take more than 3,000 mg of acetaminophen either as a separate medication or as in ingredient in an over-the-counter cold/flu preparation within a 24-hour period.      Robitussin, Mucinex (guaifenesin): This is an expectorant.  This helps break up chest congestion and loosen up thick nasal drainage making phlegm and drainage more liquid and therefore easier to remove.  I recommend being 400 mg three times daily as needed.      Promethazine DM: Promethazine is both a nasal decongestant and an antinausea medication that makes most patients feel fairly sleepy.  The DM is dextromethorphan, a cough suppressant found in many over-the-counter cough medications.  Please take 5 mL before bedtime to minimize your cough which will help you sleep better.  I have sent a prescription for this medication to your pharmacy.   Please follow-up within the next 5-7 days either with your primary  care provider or urgent care if your symptoms do not resolve.  If you do not have a primary care provider, we will assist you in finding one.   Thank you for visiting urgent care today.  We appreciate the opportunity to participate in your care.       This office note has been dictated using Teaching laboratory technician.  Unfortunately, and despite my best efforts, this method of dictation can sometimes lead to occasional typographical or grammatical errors.  I apologize in advance if this occurs.     Theadora Rama Scales, PA-C 01/28/22 1300

## 2022-01-29 ENCOUNTER — Ambulatory Visit (HOSPITAL_BASED_OUTPATIENT_CLINIC_OR_DEPARTMENT_OTHER)
Admission: RE | Admit: 2022-01-29 | Discharge: 2022-01-29 | Disposition: A | Discharge status: Home / Self Care | Payer: Commercial Managed Care - HMO | Source: Ambulatory Visit | Attending: Emergency Medicine | Admitting: Emergency Medicine

## 2022-01-29 DIAGNOSIS — R079 Chest pain, unspecified: Secondary | ICD-10-CM | POA: Insufficient documentation

## 2022-01-29 DIAGNOSIS — R059 Cough, unspecified: Secondary | ICD-10-CM | POA: Insufficient documentation

## 2022-09-12 ENCOUNTER — Emergency Department (HOSPITAL_COMMUNITY): Payer: BLUE CROSS/BLUE SHIELD

## 2022-09-12 ENCOUNTER — Emergency Department (HOSPITAL_COMMUNITY)
Admission: EM | Admit: 2022-09-12 | Discharge: 2022-09-13 | Disposition: A | Discharge status: Home / Self Care | Payer: BLUE CROSS/BLUE SHIELD | Attending: Emergency Medicine | Admitting: Emergency Medicine

## 2022-09-12 DIAGNOSIS — R0789 Other chest pain: Secondary | ICD-10-CM | POA: Diagnosis not present

## 2022-09-12 DIAGNOSIS — Z79899 Other long term (current) drug therapy: Secondary | ICD-10-CM | POA: Insufficient documentation

## 2022-09-12 DIAGNOSIS — E119 Type 2 diabetes mellitus without complications: Secondary | ICD-10-CM | POA: Insufficient documentation

## 2022-09-12 DIAGNOSIS — R079 Chest pain, unspecified: Secondary | ICD-10-CM

## 2022-09-12 LAB — CBC
HCT: 38.1 % (ref 36.0–46.0)
Hemoglobin: 12.2 g/dL (ref 12.0–15.0)
MCH: 30.9 pg (ref 26.0–34.0)
MCHC: 32 g/dL (ref 30.0–36.0)
MCV: 96.5 fL (ref 80.0–100.0)
Platelets: 199 10*3/uL (ref 150–400)
RBC: 3.95 MIL/uL (ref 3.87–5.11)
RDW: 13.3 % (ref 11.5–15.5)
WBC: 5.8 10*3/uL (ref 4.0–10.5)
nRBC: 0 % (ref 0.0–0.2)

## 2022-09-12 LAB — BASIC METABOLIC PANEL
Anion gap: 9 (ref 5–15)
BUN: 15 mg/dL (ref 8–23)
CO2: 27 mmol/L (ref 22–32)
Calcium: 9.1 mg/dL (ref 8.9–10.3)
Chloride: 103 mmol/L (ref 98–111)
Creatinine, Ser: 0.97 mg/dL (ref 0.44–1.00)
GFR, Estimated: >60 mL/min (ref >60)
Glucose, Bld: 92 mg/dL (ref 70–99)
Potassium: 4.1 mmol/L (ref 3.5–5.1)
Sodium: 139 mmol/L (ref 135–145)

## 2022-09-12 LAB — TROPONIN I (HIGH SENSITIVITY)
Troponin I (High Sensitivity): 3 ng/L (ref <18)
Troponin I (High Sensitivity): 3 ng/L (ref <18)

## 2022-09-12 NOTE — ED Triage Notes (Signed)
Pt to ED c/o chest pain since Monday , intermittent in nature, reports no aggravating or alleviating factors. Denies SHOB/N/V.

## 2022-09-12 NOTE — ED Provider Triage Note (Signed)
Emergency Medicine Provider Triage Evaluation Note  Katlynd Mclaren , a 62 y.o. female  was evaluated in triage.  Pt complains of intermittent chest pain that feels like stabbing that lasts for around a minute at a time intermittently for around 1 week.  Patient reports that she went from having around 1 episode per day to having 3 episodes today.  She denies any exertional component of the chest pain.  She denies any nausea, vomiting, radiation to jaw, neck.  She denies shortness of breath.Normal echo / stress test ~4 years ago. Endorses hx of costochondritis.  Review of Systems  Positive: Chest pain Negative: Shortness of breath, nausea, vomiting  Physical Exam  BP 138/80 (BP Location: Right Arm)   Pulse 80   Temp 98.3 F (36.8 C) (Oral)   Resp 18   Ht '5\' 6"'$  (1.676 m)   Wt 109.8 kg   SpO2 99%   BMI 39.06 kg/m  Gen:   Awake, no distress   Resp:  Normal effort  MSK:   Moves extremities without difficulty  Other:  Some ttp of chest wall on my exam  Medical Decision Making  Medically screening exam initiated at 5:58 PM.  Appropriate orders placed.  Alyzae Decrescenzo was informed that the remainder of the evaluation will be completed by another provider, this initial triage assessment does not replace that evaluation, and the importance of remaining in the ED until their evaluation is complete.  Workup initiated in triage    Anselmo Pickler, PA-C 09/12/22 1800

## 2022-09-13 NOTE — Discharge Instructions (Signed)
Your workup today is reassuring.  Follow-up with your doctor as discussed.  Return to ER for worsening or concerning symptoms.

## 2022-09-13 NOTE — ED Provider Notes (Signed)
[facility] Provider Note   CSN: [id number] Arrival date & time: 09/12/22  1741     History  Chief Complaint  Patient presents with   Chest Pain    Karina Brooks is a 62 y.o. female.  62 year old female with history of DM, hyperlipidemia, presents with complaint of left side chest discomfort. Onset 1 week ago, intermittent, located near left mid chest, between midaxillary and midclavicular space, pain is intermittent over the course of 1 minute, throbbing in nature and then resolves.  Typically occurs once daily however had 3 episodes on Friday which prompted her to go to her doctor's office, then had additional 3 episodes.  Not having any pain at this time.  Denies any associated nausea, shortness of breath, diaphoresis.  Denies exertional component. PCP sent to the ER due to family history (son had MI at age 28, mother and father with MI over the age of 40yo).  Patient is a non-smoker.  Denies recent extended travel, hormone therapy, leg swelling, history of PE or DVT.       Home Medications Prior to Admission medications   Medication Sig Start Date End Date Taking? Authorizing Provider  albuterol (VENTOLIN HFA) 108 (90 Base) MCG/ACT inhaler Inhale 2 puffs into the lungs every 6 (six) hours as needed for wheezing or shortness of breath (Cough). 01/22/22   Lynden Oxford Scales, PA-C  APPLE CIDER VINEGAR PO Take by mouth. gummies    [provider]  Ascorbic Acid (VITAMIN C PO) Take by mouth.    [provider]  BIOTIN PO Take by mouth.    [provider]  Cholecalciferol (VITAMIN D3 PO) Take 2,000 Int'l Units by mouth.    [provider]  co-enzyme Q-10 50 MG capsule Take 50 mg by mouth daily.     [provider]  fluticasone (FLONASE) 50 MCG/ACT nasal spray Place 1 spray into both nostrils daily. 01/22/22   Lynden Oxford Scales, PA-C  guaifenesin (HUMIBID E) 400 MG TABS tablet Take 1 tablet 3  times daily as needed for chest congestion and cough 01/27/22   Lynden Oxford Scales, PA-C  ibuprofen (ADVIL,MOTRIN) 600 MG tablet Take 600 mg by mouth as needed.  05/03/18   [provider]  ipratropium (ATROVENT) 0.06 % nasal spray Place 2 sprays into both nostrils 3 (three) times daily. As needed for nasal congestion, runny nose 01/27/22   Lynden Oxford Scales, PA-C  levocetirizine (XYZAL) 5 MG tablet Take 1 tablet (5 mg total) by mouth every evening. 01/22/22 07/21/22  Lynden Oxford Scales, PA-C  loratadine (CLARITIN) 10 MG tablet Take by mouth.    [provider]  Multiple Vitamin (MULTIVITAMIN) capsule Take by mouth.    [provider]  nystatin-triamcinolone (MYCOLOG II) cream Apply 1 application topically as needed.     [provider]  Omega-3 Fatty Acids (FISH OIL) 1000 MG CAPS Take by mouth.    [provider]  omeprazole (PRILOSEC) 20 MG capsule Take 20 mg by mouth daily. 07/26/18   [provider]  promethazine-dextromethorphan (PROMETHAZINE-DM) 6.25-15 MG/5ML syrup Take 5 mLs by mouth 4 (four) times daily as needed for cough. 01/27/22   Lynden Oxford Scales, PA-C  vitamin B-12 (CYANOCOBALAMIN) 100 MCG tablet Take 200 mcg by mouth.     [provider]      Allergies    Simvastatin    Review of Systems   Review of Systems Negative except as per HPI Physical  Exam Updated Vital Signs BP 125/75 (BP Location: Right Arm)   Pulse 67   Temp 97.9 F (36.6 C) (Oral)   Resp 16   Ht '5\' 6"'$  (1.676 m)   Wt 109.8 kg   SpO2 100%   BMI 39.06 kg/m  Physical Exam Vitals and nursing note reviewed.  Constitutional:      General: She is not in acute distress.    Appearance: She is well-developed. She is not diaphoretic.  HENT:     Head: Normocephalic and atraumatic.  Cardiovascular:     Rate and Rhythm: Normal rate and regular rhythm.     Heart sounds: Normal heart sounds.  Pulmonary:     Effort: Pulmonary effort is normal.      Breath sounds: Normal breath sounds.  Chest:     Chest wall: No tenderness.  Abdominal:     Palpations: Abdomen is soft.     Tenderness: There is no abdominal tenderness.  Musculoskeletal:     Right lower leg: No tenderness. No edema.     Left lower leg: No tenderness. No edema.  Skin:    General: Skin is warm and dry.  Neurological:     Mental Status: She is alert and oriented to person, place, and time.  Psychiatric:        Behavior: Behavior normal.     ED Results / Procedures / Treatments   Labs (all labs ordered are listed, but only abnormal results are displayed) Labs Reviewed  BASIC METABOLIC PANEL  CBC  TROPONIN I (HIGH SENSITIVITY)  TROPONIN I (HIGH SENSITIVITY)    EKG EKG Interpretation  Date/Time:  Friday September 12 2022 17:47:06 EST Ventricular Rate:  69 PR Interval:  146 QRS Duration: 74 QT Interval:  384 QTC Calculation: 411 R Axis:   63 Text Interpretation: Normal sinus rhythm with sinus arrhythmia Normal ECG When compared with ECG of 24-Sep-2020 17:16, PREVIOUS ECG IS PRESENT Confirmed by Merrily Pew [phone]) on 09/13/2022 1:13:45 AM  Radiology DG Chest 2 View  Result Date: 09/12/2022 CLINICAL DATA:  Chest pain EXAM: CHEST - 2 VIEW COMPARISON:  01/29/2022 and older FINDINGS: Hyperinflation. No consolidation, pneumothorax or effusion. Degenerative changes are seen along the spine. IMPRESSION: Hyperinflation.  No acute cardiopulmonary disease Electronically Signed   By: Jill Side M.D.   On: 09/12/2022 18:25    Procedures Procedures    Medications Ordered in ED Medications - No data to display  ED Course/ Medical Decision Making/ A&P                             Medical Decision Making  This patient presents to the ED for concern of chest pain, this involves an extensive number of treatment options, and is a complaint that carries with it a high risk of complications and morbidity.  The differential diagnosis includes but not limited to  ACS, PE, arrhythmia   Co morbidities that complicate the patient evaluation  AM, hyperlipidemia    Additional history obtained:  External records from outside source obtained and reviewed including note from UC visit dated 09/12/22   Lab Tests:  I Ordered, and personally interpreted labs.  The pertinent results include: CBC and BMP unremarkable.  Troponin x 2 negative.   Imaging Studies ordered:  I ordered imaging studies including chest x-ray I independently visualized and interpreted imaging which showed hyperinflation, no acute abnormality I agree with the radiologist interpretation   Cardiac Monitoring: / EKG:  The patient was maintained on a cardiac monitor.  I personally viewed and interpreted the cardiac monitored which showed an underlying rhythm of: Normal sinus rhythm, rate 69  Problem List / ED Course / Critical interventions / Medication management  62 year old female with chest pain as above without associated symptoms. Exam unremarkable, work up reassuring. Recommend follow up with PCP for recheck, consider further cardiac work up. Return to ER for worsening or concerning symptoms.  I have reviewed the patients home medicines and have made adjustments as needed   Social Determinants of Health:  Has PCP   Test / Admission - Considered:  Low risk for PE         Final Clinical Impression(s) / ED Diagnoses Final diagnoses:  Chest pain, unspecified type    Rx / DC Orders ED Discharge Orders     None         Roque Lias 09/13/22 0226    Merrily Pew, MD 09/13/22 [phone]

## 2024-04-29 ENCOUNTER — Other Ambulatory Visit: Payer: Self-pay | Admitting: Ophthalmology

## 2024-04-29 DIAGNOSIS — H05112 Granuloma of left orbit: Secondary | ICD-10-CM

## 2024-05-04 ENCOUNTER — Ambulatory Visit
Admission: RE | Admit: 2024-05-04 | Discharge: 2024-05-04 | Disposition: A | Discharge status: Home / Self Care | Source: Ambulatory Visit | Attending: Ophthalmology | Admitting: Ophthalmology

## 2024-05-04 DIAGNOSIS — H05112 Granuloma of left orbit: Secondary | ICD-10-CM
